# Patient Record
Sex: Female | Born: 1975
Health system: Southern US, Community
[De-identification: ages and names within clinical notes are randomized; demographics above are authoritative.]

## PROBLEM LIST (undated history)

## (undated) DIAGNOSIS — K219 Gastro-esophageal reflux disease without esophagitis: Secondary | ICD-10-CM

## (undated) HISTORY — DX: Gastro-esophageal reflux disease without esophagitis: K21.9

## (undated) HISTORY — PX: TUBAL LIGATION: SHX77

## (undated) HISTORY — PX: FOOT SURGERY: SHX648

---

## 1999-08-12 ENCOUNTER — Other Ambulatory Visit: Admission: RE | Admit: 1999-08-12 | Discharge: 1999-08-12 | Payer: Self-pay | Admitting: Obstetrics

## 2000-01-12 ENCOUNTER — Inpatient Hospital Stay (HOSPITAL_COMMUNITY): Admission: AD | Admit: 2000-01-12 | Discharge: 2000-01-12 | Payer: Self-pay | Admitting: Obstetrics

## 2000-01-14 ENCOUNTER — Inpatient Hospital Stay (HOSPITAL_COMMUNITY): Admission: AD | Admit: 2000-01-14 | Discharge: 2000-01-16 | Payer: Self-pay | Admitting: Obstetrics

## 2000-10-12 ENCOUNTER — Emergency Department (HOSPITAL_COMMUNITY): Admission: EM | Admit: 2000-10-12 | Discharge: 2000-10-12 | Payer: Self-pay | Admitting: Emergency Medicine

## 2000-10-13 ENCOUNTER — Emergency Department (HOSPITAL_COMMUNITY): Admission: EM | Admit: 2000-10-13 | Discharge: 2000-10-13 | Payer: Self-pay | Admitting: Emergency Medicine

## 2001-06-08 ENCOUNTER — Emergency Department (HOSPITAL_COMMUNITY): Admission: EM | Admit: 2001-06-08 | Discharge: 2001-06-08 | Payer: Self-pay | Admitting: Emergency Medicine

## 2002-02-18 ENCOUNTER — Encounter (HOSPITAL_COMMUNITY): Admission: AD | Admit: 2002-02-18 | Discharge: 2002-02-18 | Payer: Self-pay | Admitting: Obstetrics

## 2002-02-22 ENCOUNTER — Inpatient Hospital Stay (HOSPITAL_COMMUNITY): Admission: AD | Admit: 2002-02-22 | Discharge: 2002-02-25 | Payer: Self-pay | Admitting: Obstetrics

## 2003-03-11 ENCOUNTER — Emergency Department (HOSPITAL_COMMUNITY): Admission: EM | Admit: 2003-03-11 | Discharge: 2003-03-11 | Payer: Self-pay | Admitting: Emergency Medicine

## 2003-03-11 ENCOUNTER — Encounter: Payer: Self-pay | Admitting: Orthopedic Surgery

## 2003-03-11 ENCOUNTER — Encounter: Payer: Self-pay | Admitting: Emergency Medicine

## 2003-03-16 ENCOUNTER — Observation Stay (HOSPITAL_COMMUNITY): Admission: RE | Admit: 2003-03-16 | Discharge: 2003-03-17 | Payer: Self-pay | Admitting: Orthopedic Surgery

## 2003-03-16 ENCOUNTER — Encounter: Payer: Self-pay | Admitting: Orthopedic Surgery

## 2006-08-22 ENCOUNTER — Emergency Department (HOSPITAL_COMMUNITY): Admission: EM | Admit: 2006-08-22 | Discharge: 2006-08-23 | Payer: Self-pay | Admitting: Emergency Medicine

## 2006-08-23 ENCOUNTER — Emergency Department (HOSPITAL_COMMUNITY): Admission: EM | Admit: 2006-08-23 | Discharge: 2006-08-23 | Payer: Self-pay | Admitting: Family Medicine

## 2006-10-18 ENCOUNTER — Emergency Department (HOSPITAL_COMMUNITY): Admission: EM | Admit: 2006-10-18 | Discharge: 2006-10-18 | Payer: Self-pay | Admitting: Emergency Medicine

## 2012-12-20 ENCOUNTER — Encounter (HOSPITAL_COMMUNITY): Payer: Self-pay | Admitting: Emergency Medicine

## 2012-12-20 ENCOUNTER — Emergency Department (HOSPITAL_COMMUNITY)
Admission: EM | Admit: 2012-12-20 | Discharge: 2012-12-20 | Disposition: A | Discharge status: Home / Self Care | Payer: 59 | Source: Home / Self Care | Attending: Emergency Medicine | Admitting: Emergency Medicine

## 2012-12-20 DIAGNOSIS — S93409A Sprain of unspecified ligament of unspecified ankle, initial encounter: Secondary | ICD-10-CM

## 2012-12-20 MED ORDER — IBUPROFEN 800 MG PO TABS
800.0000 mg | ORAL_TABLET | Freq: Once | ORAL | Status: AC
  Administered 2012-12-20: 800 mg via ORAL

## 2012-12-20 MED ORDER — IBUPROFEN 800 MG PO TABS: 800.0000 mg | ORAL_TABLET | Freq: Three times a day (TID) | ORAL | Status: DC | PRN

## 2012-12-20 MED ORDER — IBUPROFEN 800 MG PO TABS
ORAL_TABLET | ORAL | Status: AC
  Filled 2012-12-20: qty 1

## 2012-12-20 NOTE — ED Provider Notes (Signed)
History     CSN: [id number]  Arrival date & time 12/20/12  1827   First MD Initiated Contact with Patient 12/20/12 1928      Chief Complaint  Patient presents with  . Foot Injury    (Consider location/radiation/quality/duration/timing/severity/associated sxs/prior treatment) HPI Comments: Pt rolled ankle while playing volleyball this afternoon.  Initially, for 3.75 hours, felt fine - had no pain or swelling.  After rested for a brief while, got up and noticed L ankle pain and swelling.    Patient is a 37 y.o. female presenting with foot injury. The history is provided by the patient.  Foot Injury Location:  Ankle Time since incident:  6 hours Injury: yes   Mechanism of injury comment:  Foot eversion Ankle location:  L ankle Pain details:    Quality:  Aching and throbbing   Radiates to:  Does not radiate   Severity:  Moderate   Onset quality:  Gradual   Duration:  3 hours   Timing:  Constant   Progression:  Worsening Chronicity:  New Relieved by:  None tried Worsened by:  Bearing weight and activity Ineffective treatments:  Ice and elevation Associated symptoms: swelling   Associated symptoms: no numbness and no tingling     History reviewed. No pertinent past medical history.  Past Surgical History  Procedure Laterality Date  . Foot surgery Right     History reviewed. No pertinent family history.  History  Substance Use Topics  . Smoking status: Not on file  . Smokeless tobacco: Not on file  . Alcohol Use: Not on file    OB History   Grav Para Term Preterm Abortions TAB SAB Ect Mult Living                  Review of Systems  Musculoskeletal:       L ankle pain  Skin: Negative for color change and wound.  Neurological: Negative for numbness.    Allergies  Review of patient's allergies indicates no known allergies.  Home Medications   Current Outpatient Rx  Name  Route  Sig  Dispense  Refill  . ibuprofen (ADVIL,MOTRIN) 800 MG tablet   Oral  Take 1 tablet (800 mg total) by mouth every 8 (eight) hours as needed for pain.   21 tablet   0     BP 117/72  Pulse 89  Temp(Src) 100.2 F (37.9 C) (Oral)  Resp 18  SpO2 99%  LMP 12/06/2012  Physical Exam  Constitutional: She appears well-developed and well-nourished. No distress.  Musculoskeletal:       Left ankle: She exhibits decreased range of motion and swelling. She exhibits no deformity and normal pulse. Tenderness. Lateral malleolus and medial malleolus tenderness found.  L ankle with swelling and tender to palp medial and lateral malleoli- also decreased rom due to pain  Neurological: No sensory deficit.    ED Course  Procedures (including critical care time)  Labs Reviewed - No data to display No results found.   1. Ankle sprain and strain, left, initial encounter       MDM  Unlikely fx as pt was able to walk on injury without pain or problem for 3+ hours. Tx for sprain.         Cathlyn Parsons, NP 12/20/12 1935

## 2012-12-20 NOTE — ED Notes (Signed)
Pt left w/o discharge instructions; Rx; and work note Called pt and per her request, left the info at the front office

## 2012-12-20 NOTE — ED Provider Notes (Signed)
Medical screening examination/treatment/procedure(s) were performed by non-physician practitioner and as supervising physician I was immediately available for consultation/collaboration.  Raynald Blend, MD 12/20/12 [phone]

## 2012-12-20 NOTE — ED Notes (Signed)
Pt c/o left foot/ankle inj onset 1300 today Reports playing volleyball, jumped up and fell on left foot and twisted her ankle Placed ice initially and did not feel much pain; was walking and applying pressure until she got home and took her shoe off around 1645 Since then, pain gradually getting worse Sx include: swelling, pain that increases w/activity Denies: head inj/LOC  She is alert and oriented w/no signs of acute distress.

## 2014-09-12 ENCOUNTER — Other Ambulatory Visit: Payer: Self-pay | Admitting: Physician Assistant

## 2014-09-12 ENCOUNTER — Other Ambulatory Visit (HOSPITAL_COMMUNITY)
Admission: RE | Admit: 2014-09-12 | Discharge: 2014-09-12 | Disposition: A | Discharge status: Home / Self Care | Payer: 59 | Source: Ambulatory Visit | Attending: Family Medicine | Admitting: Family Medicine

## 2014-09-12 DIAGNOSIS — Z01419 Encounter for gynecological examination (general) (routine) without abnormal findings: Secondary | ICD-10-CM | POA: Insufficient documentation

## 2014-09-14 LAB — CYTOLOGY - PAP

## 2015-05-29 ENCOUNTER — Other Ambulatory Visit: Payer: Self-pay | Admitting: Podiatry

## 2015-05-30 NOTE — Telephone Encounter (Signed)
Pt request refill of Lamisil.

## 2015-06-07 ENCOUNTER — Ambulatory Visit: Payer: Self-pay | Admitting: Podiatry

## 2015-06-29 ENCOUNTER — Ambulatory Visit (INDEPENDENT_AMBULATORY_CARE_PROVIDER_SITE_OTHER): Payer: 59 | Admitting: Podiatry

## 2015-06-29 ENCOUNTER — Encounter: Payer: Self-pay | Admitting: Podiatry

## 2015-06-29 VITALS — BP 104/67 | HR 66 | Resp 16

## 2015-06-29 DIAGNOSIS — M79676 Pain in unspecified toe(s): Secondary | ICD-10-CM

## 2015-06-29 DIAGNOSIS — B351 Tinea unguium: Secondary | ICD-10-CM

## 2015-06-29 MED ORDER — TERBINAFINE HCL 250 MG PO TABS
250.0000 mg | ORAL_TABLET | Freq: Every day | ORAL | Status: DC
Start: 2015-06-29 — End: 2019-08-06

## 2015-06-29 NOTE — Progress Notes (Signed)
Subjective:     Patient ID: Julie Love, female   DOB: [birth year], 39 y.o.   MRN: [id number]  HPIThis patient presents to the office with follow up for fungus infected nails.  She has taken lamisil and marked improvement to the nails have occurred except second toenails both feet.  She returns for follow up for nail fungus.   Review of Systems     Objective:   Physical Exam GENERAL APPEARANCE: Alert, conversant. Appropriately groomed. No acute distress.  VASCULAR: Pedal pulses palpable at  Scnetx and PT bilateral.  Capillary refill time is immediate to all digits,  Normal temperature gradient.  Digital hair growth is present bilateral  NEUROLOGIC: sensation is normal to 5.07 monofilament at 5/5 sites bilateral.  Light touch is intact bilateral, Muscle strength normal.  MUSCULOSKELETAL: acceptable muscle strength, tone and stability bilateral.  Intrinsic muscluature intact bilateral.  Rectus appearance of foot and digits noted bilateral.   DERMATOLOGIC: skin color, texture, and turgor are within normal limits.  No preulcerative lesions or ulcers  are seen, no interdigital maceration noted.  No open lesions present.  Digital nails are asymptomatic. No drainage noted. NAILS  Her hallux and saecond toenails have disfigured and discolorment persisting.     Assessment:     Onychomycosis B/L     Plan:     ROV  Refill lamisil 250 mg # 45.  Told her the great nails are doing well proximally but her second toes need additional treatment.  RTCD prn

## 2015-07-18 ENCOUNTER — Ambulatory Visit: Payer: Self-pay | Admitting: Podiatry

## 2015-09-14 ENCOUNTER — Other Ambulatory Visit: Payer: Self-pay | Admitting: Physician Assistant

## 2015-09-14 ENCOUNTER — Other Ambulatory Visit (HOSPITAL_COMMUNITY)
Admission: RE | Admit: 2015-09-14 | Discharge: 2015-09-14 | Disposition: A | Discharge status: Home / Self Care | Payer: 59 | Source: Ambulatory Visit | Attending: Family Medicine | Admitting: Family Medicine

## 2015-09-14 DIAGNOSIS — Z124 Encounter for screening for malignant neoplasm of cervix: Secondary | ICD-10-CM | POA: Insufficient documentation

## 2015-09-17 LAB — CYTOLOGY - PAP

## 2016-09-23 DIAGNOSIS — Z0000 Encounter for general adult medical examination without abnormal findings: Secondary | ICD-10-CM | POA: Diagnosis not present

## 2016-12-04 ENCOUNTER — Encounter (HOSPITAL_COMMUNITY): Payer: Self-pay | Admitting: Emergency Medicine

## 2016-12-04 ENCOUNTER — Ambulatory Visit (HOSPITAL_COMMUNITY)
Admission: EM | Admit: 2016-12-04 | Discharge: 2016-12-04 | Disposition: A | Discharge status: Home / Self Care | Payer: 59 | Attending: Internal Medicine | Admitting: Internal Medicine

## 2016-12-04 DIAGNOSIS — S39012A Strain of muscle, fascia and tendon of lower back, initial encounter: Secondary | ICD-10-CM | POA: Diagnosis not present

## 2016-12-04 MED ORDER — CYCLOBENZAPRINE HCL 5 MG PO TABS: 5.0000 mg | ORAL_TABLET | Freq: Two times a day (BID) | ORAL | 0 refills | Status: DC | PRN

## 2016-12-04 MED ORDER — MELOXICAM 7.5 MG PO TABS: 7.5000 mg | ORAL_TABLET | Freq: Every day | ORAL | 0 refills | Status: DC

## 2016-12-04 NOTE — Discharge Instructions (Signed)
°  Flexeril is a muscle relaxer and may cause drowsiness. Do not drink alcohol, drive, or operate heavy machinery while taking.  Meloxicam (Mobic) is an antiinflammatory to help with pain and inflammation.  Do not take ibuprofen, Advil, Aleve, or any other medications that contain NSAIDs while taking meloxicam as this may cause stomach upset or even ulcers if taken in large amounts for an extended period of time.
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
## 2016-12-11 DIAGNOSIS — M542 Cervicalgia: Secondary | ICD-10-CM | POA: Diagnosis not present

## 2016-12-11 DIAGNOSIS — M546 Pain in thoracic spine: Secondary | ICD-10-CM | POA: Diagnosis not present

## 2017-01-12 ENCOUNTER — Encounter: Payer: Self-pay | Admitting: Family Medicine

## 2017-01-12 ENCOUNTER — Ambulatory Visit (INDEPENDENT_AMBULATORY_CARE_PROVIDER_SITE_OTHER): Payer: No Typology Code available for payment source | Admitting: Family Medicine

## 2017-01-12 VITALS — BP 114/64 | HR 70 | Temp 97.3°F | Resp 12 | Ht 67.0 in | Wt 217.2 lb

## 2017-01-12 DIAGNOSIS — R200 Anesthesia of skin: Secondary | ICD-10-CM

## 2017-01-12 DIAGNOSIS — E6609 Other obesity due to excess calories: Secondary | ICD-10-CM | POA: Diagnosis not present

## 2017-01-12 DIAGNOSIS — E669 Obesity, unspecified: Secondary | ICD-10-CM | POA: Insufficient documentation

## 2017-01-12 DIAGNOSIS — Z6834 Body mass index (BMI) 34.0-34.9, adult: Secondary | ICD-10-CM

## 2017-01-12 DIAGNOSIS — M549 Dorsalgia, unspecified: Secondary | ICD-10-CM | POA: Diagnosis not present

## 2017-01-12 DIAGNOSIS — S3992XD Unspecified injury of lower back, subsequent encounter: Secondary | ICD-10-CM

## 2017-01-12 MED ORDER — MELOXICAM 7.5 MG PO TABS: 7.5000 mg | ORAL_TABLET | Freq: Every day | ORAL | 0 refills | Status: DC

## 2017-01-12 MED ORDER — METHOCARBAMOL 500 MG PO TABS: 500.0000 mg | ORAL_TABLET | Freq: Three times a day (TID) | ORAL | 1 refills | Status: DC | PRN
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
## 2017-01-12 NOTE — Progress Notes (Signed)
Pre visit review using our clinic review tool, if applicable. No additional management support is needed unless otherwise documented below in the visit note. 

## 2017-01-12 NOTE — Patient Instructions (Addendum)
WE NOW OFFER   [facility] FAST TRACK!!!  SAME DAY Appointments for ACUTE CARE  Such as: Sprains, Injuries, cuts, abrasions, rashes, muscle pain, joint pain, back pain Colds, flu, sore throats, headache, allergies, cough, fever  Ear pain, sinus and eye infections Abdominal pain, nausea, vomiting, diarrhea, upset stomach Animal/insect bites  3 Easy Ways to Schedule: Walk-In Scheduling Call in scheduling Mychart Sign-up: [email or web address]   A few things to remember from today's visit:   Acute right-sided back pain, unspecified back location - Plan: Ambulatory referral to Orthopedic Surgery, DG Lumbar Spine Complete, DG Thoracic Spine W/Swimmers, methocarbamol (ROBAXIN) 500 MG tablet, meloxicam (MOBIC) 7.5 MG tablet  Class 1 obesity due to excess calories without serious comorbidity with body mass index (BMI) of 34.0 to 34.9 in adult  Injury of back, subsequent encounter - Plan: Ambulatory referral to Orthopedic Surgery, DG Lumbar Spine Complete, DG Thoracic Spine W/Swimmers, methocarbamol (ROBAXIN) 500 MG tablet, meloxicam (MOBIC) 7.5 MG tablet  Finger numbness  Today X ray was ordered.  This can be done at [facility] between 8 am and 5 pm: [place]. [phone].   Please be sure medication list is accurate. If a new problem present, please set up appointment sooner than planned today.

## 2017-01-13 ENCOUNTER — Ambulatory Visit (INDEPENDENT_AMBULATORY_CARE_PROVIDER_SITE_OTHER)
Admission: RE | Admit: 2017-01-13 | Discharge: 2017-01-13 | Disposition: A | Discharge status: Home / Self Care | Payer: No Typology Code available for payment source | Source: Ambulatory Visit | Attending: Family Medicine | Admitting: Family Medicine

## 2017-01-13 DIAGNOSIS — M549 Dorsalgia, unspecified: Secondary | ICD-10-CM

## 2017-01-13 DIAGNOSIS — S3992XD Unspecified injury of lower back, subsequent encounter: Secondary | ICD-10-CM

## 2017-01-17 ENCOUNTER — Encounter: Payer: Self-pay | Admitting: Family Medicine

## 2017-01-22 ENCOUNTER — Ambulatory Visit (INDEPENDENT_AMBULATORY_CARE_PROVIDER_SITE_OTHER): Payer: No Typology Code available for payment source | Admitting: Orthopedic Surgery

## 2017-01-22 ENCOUNTER — Encounter (INDEPENDENT_AMBULATORY_CARE_PROVIDER_SITE_OTHER): Payer: Self-pay | Admitting: Orthopedic Surgery

## 2017-01-22 DIAGNOSIS — M549 Dorsalgia, unspecified: Secondary | ICD-10-CM
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
## 2017-02-01 ENCOUNTER — Ambulatory Visit
Admission: RE | Admit: 2017-02-01 | Discharge: 2017-02-01 | Disposition: A | Discharge status: Home / Self Care | Payer: No Typology Code available for payment source | Source: Ambulatory Visit | Attending: Orthopedic Surgery | Admitting: Orthopedic Surgery

## 2017-02-01 DIAGNOSIS — M549 Dorsalgia, unspecified: Secondary | ICD-10-CM

## 2017-02-11 ENCOUNTER — Ambulatory Visit (INDEPENDENT_AMBULATORY_CARE_PROVIDER_SITE_OTHER): Payer: No Typology Code available for payment source | Admitting: Orthopedic Surgery

## 2017-02-11 ENCOUNTER — Encounter (INDEPENDENT_AMBULATORY_CARE_PROVIDER_SITE_OTHER): Payer: Self-pay | Admitting: Orthopedic Surgery

## 2017-02-11 DIAGNOSIS — M546 Pain in thoracic spine: Secondary | ICD-10-CM | POA: Diagnosis not present
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
## 2017-02-16 ENCOUNTER — Telehealth (INDEPENDENT_AMBULATORY_CARE_PROVIDER_SITE_OTHER): Payer: Self-pay | Admitting: Orthopedic Surgery

## 2017-02-16 DIAGNOSIS — M546 Pain in thoracic spine: Secondary | ICD-10-CM

## 2017-02-16 NOTE — Telephone Encounter (Signed)
[facility] called to state that they are not in network/contracted with the patient's insurance [employer]. Please call patient and refer to another Physical Therapy place.

## 2017-02-16 NOTE — Telephone Encounter (Signed)
S/w patient and referred to [facility] on [address] per her request.

## 2017-02-26 ENCOUNTER — Ambulatory Visit: Payer: No Typology Code available for payment source | Attending: Orthopedic Surgery | Admitting: Physical Therapy

## 2017-02-26 DIAGNOSIS — M546 Pain in thoracic spine: Secondary | ICD-10-CM | POA: Diagnosis present

## 2017-02-26 DIAGNOSIS — M6281 Muscle weakness (generalized): Secondary | ICD-10-CM | POA: Diagnosis present

## 2017-02-26 DIAGNOSIS — M62838 Other muscle spasm: Secondary | ICD-10-CM | POA: Diagnosis present
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
## 2017-03-05 ENCOUNTER — Ambulatory Visit: Payer: No Typology Code available for payment source | Attending: Orthopedic Surgery | Admitting: Physical Therapy

## 2017-03-05 ENCOUNTER — Encounter: Payer: Self-pay | Admitting: Physical Therapy

## 2017-03-05 DIAGNOSIS — M62838 Other muscle spasm: Secondary | ICD-10-CM | POA: Insufficient documentation

## 2017-03-05 DIAGNOSIS — M6281 Muscle weakness (generalized): Secondary | ICD-10-CM | POA: Insufficient documentation

## 2017-03-05 DIAGNOSIS — M546 Pain in thoracic spine: Secondary | ICD-10-CM | POA: Diagnosis not present

## 2017-03-05 NOTE — Therapy (Signed)
[place], [place], [address] Phone: [phone]   Fax:  [phone]  Physical Therapy Treatment  Patient Details  Name: Julie Love MRN: [id number] Date of Birth: Jan 05, 1976 Referring Provider: Dr Meredith Pel   Encounter Date: 03/05/2017      PT End of Session - 03/05/17 2130    Visit Number 2   Number of Visits 16   Date for PT Re-Evaluation 04/24/17   Authorization Type Unkown    PT Start Time 1015   PT Stop Time 1107   PT Time Calculation (min) 52 min   Activity Tolerance Patient tolerated treatment well   Behavior During Therapy [facility] for tasks assessed/performed      Past Medical History:  Diagnosis Date  . GERD (gastroesophageal reflux disease)     Past Surgical History:  Procedure Laterality Date  . CESAREAN SECTION    . FOOT SURGERY Right   . TUBAL LIGATION      There were no vitals filed for this visit.      Subjective Assessment - 03/05/17 1040    Subjective Patient continues to be sore but she has been using her stretches at work to keep her out of pain. Overall it feels about the same but she is managing better. She had to do a lot of lifting at work and she was sore when she came in.    Limitations House hold activities   Diagnostic tests Thoracic x-ray: (-) for acute    Patient Stated Goals To have less pain when working    Currently in Pain? Yes   Pain Score 9    Pain Location Back   Pain Orientation Left;Mid;Upper   Pain Descriptors / Indicators Sharp;Aching   Pain Type Chronic pain   Pain Onset More than a month ago   Pain Frequency Constant   Aggravating Factors  working and lifting    Pain Relieving Factors laying flat    Effect of Pain on Daily Activities increased pain when working                          [facility] Adult PT Treatment/Exercise - 03/05/17 0001      Lumbar Exercises: Seated   Other Seated Lumbar Exercises thoracic ball  roll x10 forard 2x5 lateral;  ball press down with abdominal bracing      Shoulder Exercises: Prone   Other Prone Exercises --     Shoulder Exercises: Standing   Other Standing Exercises standing shoulder extenstion 2x10 red; Shoulder retraction 2x10 red   Other Standing Exercises Ball v wall 2x20 cw/ ccw ball press into wall 2x10 bilateral      Modalities   Modalities Moist Heat     Moist Heat Therapy   Number Minutes Moist Heat 10 Minutes   Moist Heat Location Other (comment)  thoriacic      Manual Therapy   Manual therapy comments trigger point release to medial scpaualr border/ Thoraic area to reduce inflammtion. Corss hand Grade III throacic manipulation.       Prosthetics   Prosthetic Care Comments  Light                 PT Education - 03/05/17 2124    Education provided Yes   Education Details improtance of strengthening    Person(s) Educated Patient   Methods Explanation;Demonstration   Comprehension Verbalized understanding;Returned demonstration;Verbal cues required;Tactile cues required  PT Short Term Goals - 02/27/17 0641      PT SHORT TERM GOAL #1   Title Patient will demsotrate decreased spasiming of the medial scapualr border    Time 4   Period Weeks   Status New     PT SHORT TERM GOAL #2   Title Patient will be independent with inital HEP for postrual correction and scpaular strength    Time 4   Period Weeks   Status New     PT SHORT TERM GOAL #3   Title Patient will demsotrate full throacic rotation and extension without a significant increase in pain    Time 4   Period Weeks   Status New     PT SHORT TERM GOAL #4   Title Patient will increase gross right scpaular strength to 4+/5    Time 4   Period Weeks   Status New           PT Long Term Goals - 02/27/17 4098      PT LONG TERM GOAL #1   Title Patient will return to home exercises including planks and other high level core work without increased pain    Time 8    Period Weeks   Status New     PT LONG TERM GOAL #2   Title Patient will lift a 10 lb box 10x without increased pain in order to perfrom work tasks    Time 8   Period Weeks   Status New     PT LONG TERM GOAL #3   Title Patient will demsotrate a 19% limitation on FOTO    Time 8   Period Weeks   Status New               Plan - 03/05/17 2133    Clinical Impression Statement Patient reported inmproved pain and motion after treatment. She was encouraged to continue strengthening. Patient has significant trigger points. She may benefit from trigger point dry needling to improve symptoms. Therapy will attmept next visit.    Clinical Presentation Evolving   Clinical Decision Making Low   Rehab Potential Good   PT Frequency 2x / week   PT Duration 8 weeks   PT Treatment/Interventions ADLs/Self Care Home Management;Cryotherapy;Electrical Stimulation;Iontophoresis 4mg /ml Dexamethasone;Gait training;Stair training;Moist Heat;Ultrasound;Therapeutic activities;Therapeutic exercise;Patient/family education;Passive range of motion;Manual techniques;Dry needling;Splinting;Taping;Neuromuscular re-education   PT Next Visit Plan Continue with scpaular strengthening, Consider prone extension, rows, Wall press ups; Review stretches and see if any have helped. Continue with manual therapy. Review work Administrator up    [place] prayer, lateral prayer, posterior capusule stretch with rotation, Mid throacic stretch over head; Sink stretch, tennis ball trigger point release; Scpa retraction, Scap extension    Consulted and Agree with Plan of Care Patient      Patient will benefit from skilled therapeutic intervention in order to improve the following deficits and impairments:  Decreased activity tolerance, Decreased strength, Increased muscle spasms, Impaired UE functional use, Pain  Visit Diagnosis: Pain in thoracic spine  Other muscle spasm  Muscle weakness  (generalized)     Problem List Patient Active Problem List   Diagnosis Date Noted  . Class 1 obesity without serious comorbidity with body mass index (BMI) of 34.0 to 34.9 in adult 01/12/2017    Carney Living PT DPT  03/05/2017, 9:38 PM  [facility] [address] [place], [place], [address] Phone: [phone]   Fax:  [phone]  Name: Julie  KRITHI Love MRN: [id number] Date of Birth: 19-Mar-1976

## 2017-03-10 ENCOUNTER — Ambulatory Visit: Payer: No Typology Code available for payment source | Admitting: Physical Therapy

## 2017-03-10 DIAGNOSIS — M546 Pain in thoracic spine: Secondary | ICD-10-CM

## 2017-03-10 DIAGNOSIS — M6281 Muscle weakness (generalized): Secondary | ICD-10-CM

## 2017-03-10 DIAGNOSIS — M62838 Other muscle spasm: Secondary | ICD-10-CM

## 2017-03-12 NOTE — Therapy (Signed)
[place], [place], [address] Phone: [phone]   Fax:  [phone]  Physical Therapy Treatment  Patient Details  Name: Julie Love MRN: [id number] Date of Birth: 1976/05/24 Referring Provider: Dr Meredith Pel   Encounter Date: 03/10/2017    Past Medical History:  Diagnosis Date  . GERD (gastroesophageal reflux disease)     Past Surgical History:  Procedure Laterality Date  . CESAREAN SECTION    . FOOT SURGERY Right   . TUBAL LIGATION      There were no vitals filed for this visit.      Subjective Assessment - 03/12/17 1219    Subjective Patient reports her pain has been better. She is having some pain today but it is much less then it has been. She is using her stretches and her exercises.    Limitations House hold activities   Diagnostic tests Thoracic x-ray: (-) for acute    Patient Stated Goals To have less pain when working    Currently in Pain? Yes   Pain Score 3    Pain Location Back   Pain Orientation Left;Mid;Upper   Pain Type Chronic pain   Pain Onset More than a month ago   Aggravating Factors  working and lifting    Pain Relieving Factors laying flat    Effect of Pain on Daily Activities increased pain when working                          [facility] Adult PT Treatment/Exercise - 03/12/17 0001      Shoulder Exercises: Prone   Other Prone Exercises posterior capuale stretch with rotation 2x10      Shoulder Exercises: Standing   Other Standing Exercises standing shoulder extenstion 3x10 green; Shoulder retraction 3x10  green; Box lifts 2x5 with 10lb wieight in box; Stand and move box to the side with a step x5;    Other Standing Exercises Ball v wall 2x20 cw/ ccw ball press into wall 2x10 bilateral      Manual Therapy   Manual therapy comments trigger point release to medial scpaualr border/ Thoraic area to reduce inflammtion. Cross hand Grade III  throacic manipulation.  Softtissue mobilization around the area TPDN perfromed.           Trigger Point Dry Needling - 03/12/17 1221    Consent Given? Yes   Education Handout Provided Yes   Muscles Treated Upper Body --  thoracic multifidus T-7; T-8 and T-9 Twitch elicited                 PT Short Term Goals - 03/10/17 1317      PT SHORT TERM GOAL #1   Title Patient will demsotrate decreased spasiming of the medial scapualr border    Baseline decreased per palpation. Assess carryover.    Time 4   Period Weeks   Status On-going     PT SHORT TERM GOAL #2   Title Patient will be independent with inital HEP for postrual correction and scpaular strength    Time 4   Period Weeks   Status On-going     PT SHORT TERM GOAL #3   Title Patient will demsotrate full throacic rotation and extension without a significant increase in pain    Time 4   Period Weeks   Status On-going     PT SHORT TERM GOAL #4   Title Patient will increase gross right  scpaular strength to 4+/5    Time 4   Period Weeks   Status On-going           PT Long Term Goals - 02/27/17 5409      PT LONG TERM GOAL #1   Title Patient will return to home exercises including planks and other high level core work without increased pain    Time 8   Period Weeks   Status New     PT LONG TERM GOAL #2   Title Patient will lift a 10 lb box 10x without increased pain in order to perfrom work tasks    Time 8   Period Weeks   Status New     PT LONG TERM GOAL #3   Title Patient will demsotrate a 19% limitation on FOTO    Time 8   Period Weeks   Status New               Plan - 03/12/17 1222    Clinical Impression Statement Reviewd rationale and potential adverse effects of dry needling and sings and symptoms of Pneomothorax 2nd to TPDN over the lung fields. She agreed to [facility] and is aware of poetential reactions. She reports her back has been better. She has been able to keep herself out of pain  using stretches. She  has been doing her exercises as well. Therapy reviewed lifting technique with the patient. She required only min cuing.  Given green band for home exercises.    Rehab Potential Good   PT Frequency 2x / week   PT Duration 8 weeks   PT Treatment/Interventions ADLs/Self Care Home Management;Cryotherapy;Electrical Stimulation;Iontophoresis 4mg /ml Dexamethasone;Gait training;Stair training;Moist Heat;Ultrasound;Therapeutic activities;Therapeutic exercise;Patient/family education;Passive range of motion;Manual techniques;Dry needling;Splinting;Taping;Neuromuscular re-education   PT Next Visit Plan Continue with scpaular strengthening, Consider prone extension, rows, Wall press ups; Review stretches and see if any have helped. Continue with manual therapy. Review work Administrator up. Continue with TPDN.     PT Home Exercise Plan prayer, lateral prayer, posterior capusule stretch with rotation, Mid throacic stretch over head; Sink stretch, tennis ball trigger point release; Scpa retraction, Scap extension    Consulted and Agree with Plan of Care Patient      Patient will benefit from skilled therapeutic intervention in order to improve the following deficits and impairments:  Decreased activity tolerance, Decreased strength, Increased muscle spasms, Impaired UE functional use, Pain  Visit Diagnosis: Pain in thoracic spine  Other muscle spasm  Muscle weakness (generalized)     Problem List Patient Active Problem List   Diagnosis Date Noted  . Class 1 obesity without serious comorbidity with body mass index (BMI) of 34.0 to 34.9 in adult 01/12/2017    Carney Living PT DPT  03/12/2017, 12:23 PM  [facility] [address] [place], [place], [address] Phone: [phone]   Fax:  [phone]  Name: Julie Love MRN: [id number] Date of Birth: November 26, 1975

## 2017-03-17 ENCOUNTER — Ambulatory Visit: Payer: No Typology Code available for payment source | Admitting: Physical Therapy

## 2017-03-17 DIAGNOSIS — M62838 Other muscle spasm: Secondary | ICD-10-CM

## 2017-03-17 DIAGNOSIS — M6281 Muscle weakness (generalized): Secondary | ICD-10-CM

## 2017-03-17 DIAGNOSIS — M546 Pain in thoracic spine: Secondary | ICD-10-CM

## 2017-03-17 NOTE — Therapy (Signed)
[place], [place], [address] Phone: [phone]   Fax:  [phone]  Physical Therapy Treatment  Patient Details  Name: Julie Love MRN: [id number] Date of Birth: Apr 30, 1976 Referring Provider: Dr Meredith Pel   Encounter Date: 03/17/2017      PT End of Session - 03/17/17 1037    Visit Number 4   Number of Visits 16   Date for PT Re-Evaluation 04/24/17   Authorization Type Unkown    PT Start Time 1015   PT Stop Time 1103   PT Time Calculation (min) 48 min   Activity Tolerance Patient tolerated treatment well   Behavior During Therapy [facility] for tasks assessed/performed      Past Medical History:  Diagnosis Date  . GERD (gastroesophageal reflux disease)     Past Surgical History:  Procedure Laterality Date  . CESAREAN SECTION    . FOOT SURGERY Right   . TUBAL LIGATION      There were no vitals filed for this visit.      Subjective Assessment - 03/17/17 1237    Subjective Patient continues to report improvement. Today she is just having minor pain. She has been more aware of her posture at work. She has been doing her exercises.    Limitations House hold activities   Diagnostic tests Thoracic x-ray: (-) for acute    Patient Stated Goals To have less pain when working    Currently in Pain? Yes   Pain Score 7    Pain Location Back   Pain Descriptors / Indicators Sharp;Aching   Pain Type Chronic pain   Pain Onset More than a month ago   Pain Frequency Constant   Aggravating Factors  working and lifting    Pain Relieving Factors laying flat    Effect of Pain on Daily Activities increased pain with working                            Trigger Point Dry Needling - 03/17/17 1241    Consent Given? Yes   Education Handout Provided Yes   Muscles Treated Upper Body --  thoracic multifidus from t-5, t-6, t-7               PT Education - 03/17/17 1037     Education provided Yes   Education Details reviewed quaruped exercises, updated HEP; continue with strengthening    Person(s) Educated Patient   Methods Explanation;Demonstration   Comprehension Verbalized understanding;Returned demonstration          PT Short Term Goals - 03/17/17 1239      PT SHORT TERM GOAL #1   Title Patient will demsotrate decreased spasiming of the medial scapualr border    Baseline decreased per palpation. Assess carryover.    Time 4   Period Weeks   Status On-going     PT SHORT TERM GOAL #2   Title Patient will be independent with inital HEP for postrual correction and scpaular strength    Time 4   Period Weeks   Status On-going     PT SHORT TERM GOAL #3   Title Patient will demsotrate full throacic rotation and extension without a significant increase in pain    Time 4   Period Weeks   Status On-going     PT SHORT TERM GOAL #4   Title Patient will increase gross right scpaular strength to 4+/5  Time 4   Period Weeks   Status On-going           PT Long Term Goals - 02/27/17 1660      PT LONG TERM GOAL #1   Title Patient will return to home exercises including planks and other high level core work without increased pain    Time 8   Period Weeks   Status New     PT LONG TERM GOAL #2   Title Patient will lift a 10 lb box 10x without increased pain in order to perfrom work tasks    Time 8   Period Weeks   Status New     PT LONG TERM GOAL #3   Title Patient will demsotrate a 19% limitation on FOTO    Time 8   Period Weeks   Status New               Plan - 03/17/17 1046    Clinical Impression Statement Patient tolerated treatmewnt wll. Therapy advanced exercises. She had a minor increase in pain with standing Y's. She tolerated trigger point dry needling well. Therapy needled three spots along her left throcic multifidis. The last spot themost superiro eliceted a good twitch. No significant spasming felt today just minor  tightness. She did score 2% worse on her FOTO but upon intake she had reported no difficulty with moderate activity depsite having significant pain with moderate work tasks. She is making good progress. See goal specific progress below.    Clinical Presentation Evolving   Clinical Decision Making Low   Rehab Potential Good   PT Frequency 2x / week   PT Duration 8 weeks   PT Treatment/Interventions ADLs/Self Care Home Management;Cryotherapy;Electrical Stimulation;Iontophoresis 4mg /ml Dexamethasone;Gait training;Stair training;Moist Heat;Ultrasound;Therapeutic activities;Therapeutic exercise;Patient/family education;Passive range of motion;Manual techniques;Dry needling;Splinting;Taping;Neuromuscular re-education   PT Next Visit Plan Continue with TPDN; continue with exercises.    Consulted and Agree with Plan of Care Patient      Patient will benefit from skilled therapeutic intervention in order to improve the following deficits and impairments:  Decreased activity tolerance, Decreased strength, Increased muscle spasms, Impaired UE functional use, Pain  Visit Diagnosis: Pain in thoracic spine  Other muscle spasm  Muscle weakness (generalized)     Problem List Patient Active Problem List   Diagnosis Date Noted  . Class 1 obesity without serious comorbidity with body mass index (BMI) of 34.0 to 34.9 in adult 01/12/2017    Carney Living PT DPT  03/17/2017, 12:42 PM  [facility] [address] [place], [place], [address] Phone: [phone]   Fax:  [phone]  Name: Julie Love MRN: [id number] Date of Birth: [birth year]

## 2017-03-24 ENCOUNTER — Ambulatory Visit: Payer: No Typology Code available for payment source | Admitting: Physical Therapy

## 2017-03-24 DIAGNOSIS — M546 Pain in thoracic spine: Secondary | ICD-10-CM | POA: Diagnosis not present

## 2017-03-24 DIAGNOSIS — M6281 Muscle weakness (generalized): Secondary | ICD-10-CM

## 2017-03-24 DIAGNOSIS — M62838 Other muscle spasm: Secondary | ICD-10-CM

## 2017-03-25 ENCOUNTER — Encounter: Payer: Self-pay | Admitting: Physical Therapy

## 2017-03-25 NOTE — Therapy (Signed)
[place], [place], [address] Phone: [phone]   Fax:  [phone]  Physical Therapy Treatment  Patient Details  Name: Julie Love MRN: [id number] Date of Birth: May 10, 1976 Referring Provider: Dr Meredith Pel   Encounter Date: 03/24/2017      PT End of Session - 03/25/17 1333    Visit Number 5   Number of Visits 16   Date for PT Re-Evaluation 04/24/17   Authorization Type Unkown    PT Start Time 1015   PT Stop Time 1107   PT Time Calculation (min) 52 min   Activity Tolerance Patient tolerated treatment well   Behavior During Therapy [facility] for tasks assessed/performed      Past Medical History:  Diagnosis Date  . GERD (gastroesophageal reflux disease)     Past Surgical History:  Procedure Laterality Date  . CESAREAN SECTION    . FOOT SURGERY Right   . TUBAL LIGATION      There were no vitals filed for this visit.      Subjective Assessment - 03/24/17 1052    Subjective Patient continues to report improvement. She feels her pain is about a 5/10 after working all day.    Limitations House hold activities   Diagnostic tests Thoracic x-ray: (-) for acute    Patient Stated Goals To have less pain when working    Currently in Pain? Yes   Pain Score 5    Pain Location Back   Pain Orientation Left;Mid;Upper   Pain Descriptors / Indicators Aching   Pain Type Chronic pain   Pain Onset More than a month ago   Pain Frequency Constant   Aggravating Factors  working on lifting    Pain Relieving Factors lying flat    Effect of Pain on Daily Activities continued ;ain with working                          [facility] Adult PT Treatment/Exercise - 03/25/17 0001      Shoulder Exercises: Standing   Other Standing Exercises standing shoulder extenstion 3x10 green; Shoulder retraction 3x10  green; Box lifts 2x5 with ; Standing shoulder flexion with scap retraction at the wall2x10  3lb; standing shoulder scaption x10;    Other Standing Exercises Ball v wall 2x20 cw/ ccw ball press into wall 2x10 bilateral      Manual Therapy   Manual therapy comments trigger point release to medial scpaualr border/ Thoraic area to reduce inflammtion. Cross hand Grade III throacic manipulation.  Softtissue mobilization around the area TPDN perfromed.           Trigger Point Dry Needling - 03/25/17 1353    Consent Given? Yes   Education Handout Provided Yes   Muscles Treated Upper Body --  t-3 right paraspinal t-4 right paraspinal              PT Education - 03/25/17 1332    Education provided Yes   Education Details updated HEP for wall slides; continued education on dry needling    Person(s) Educated Patient   Methods Explanation;Demonstration   Comprehension Verbalized understanding;Returned demonstration          PT Short Term Goals - 03/17/17 1239      PT SHORT TERM GOAL #1   Title Patient will demsotrate decreased spasiming of the medial scapualr border    Baseline decreased per palpation. Assess carryover.    Time 4  Period Weeks   Status On-going     PT SHORT TERM GOAL #2   Title Patient will be independent with inital HEP for postrual correction and scpaular strength    Time 4   Period Weeks   Status On-going     PT SHORT TERM GOAL #3   Title Patient will demsotrate full throacic rotation and extension without a significant increase in pain    Time 4   Period Weeks   Status On-going     PT SHORT TERM GOAL #4   Title Patient will increase gross right scpaular strength to 4+/5    Time 4   Period Weeks   Status On-going           PT Long Term Goals - 02/27/17 0174      PT LONG TERM GOAL #1   Title Patient will return to home exercises including planks and other high level core work without increased pain    Time 8   Period Weeks   Status New     PT LONG TERM GOAL #2   Title Patient will lift a 10 lb box 10x without increased pain  in order to perfrom work tasks    Time 8   Period Weeks   Status New     PT LONG TERM GOAL #3   Title Patient will demsotrate a 19% limitation on FOTO    Time 8   Period Weeks   Status New               Plan - 03/25/17 1334    Clinical Impression Statement Patients pain is moving in the right direction. She continues to have pain that reaches a 5/10 but it is dropping. Her spasming is decreasing. Her tightness was higher today. Trigger point dry needling adressed t-3 and t4 today with a good twitch respose elicited at 3. She was encouraged to continue with her strengthening.   History and Personal Factors relevant to plan of care: nothing significant    Clinical Presentation Evolving   Clinical Decision Making Low   Rehab Potential Good   PT Frequency 2x / week   PT Duration 8 weeks   PT Treatment/Interventions ADLs/Self Care Home Management;Cryotherapy;Electrical Stimulation;Iontophoresis 4mg /ml Dexamethasone;Gait training;Stair training;Moist Heat;Ultrasound;Therapeutic activities;Therapeutic exercise;Patient/family education;Passive range of motion;Manual techniques;Dry needling;Splinting;Taping;Neuromuscular re-education   PT Next Visit Plan Continue with TPDN; continue with exercises.    PT Home Exercise Plan prayer, lateral prayer, posterior capusule stretch with rotation, Mid throacic stretch over head; Sink stretch, tennis ball trigger point release; Scpa retraction, Scap extension    Consulted and Agree with Plan of Care Patient      Patient will benefit from skilled therapeutic intervention in order to improve the following deficits and impairments:  Decreased activity tolerance, Decreased strength, Increased muscle spasms, Impaired UE functional use, Pain  Visit Diagnosis: Pain in thoracic spine  Other muscle spasm  Muscle weakness (generalized)     Problem List Patient Active Problem List   Diagnosis Date Noted  . Class 1 obesity without serious  comorbidity with body mass index (BMI) of 34.0 to 34.9 in adult 01/12/2017    Carney Living PT DPT  03/25/2017, 2:00 PM  [facility] [address] [place], [place], [address] Phone: [phone]   Fax:  [phone]  Name: Julie Love MRN: [id number] Date of Birth: Aug 19, 1976

## 2017-03-31 ENCOUNTER — Ambulatory Visit: Payer: No Typology Code available for payment source | Attending: Orthopedic Surgery | Admitting: Physical Therapy

## 2017-03-31 DIAGNOSIS — M546 Pain in thoracic spine: Secondary | ICD-10-CM | POA: Diagnosis present

## 2017-03-31 DIAGNOSIS — M6281 Muscle weakness (generalized): Secondary | ICD-10-CM | POA: Insufficient documentation

## 2017-03-31 DIAGNOSIS — M62838 Other muscle spasm: Secondary | ICD-10-CM | POA: Insufficient documentation

## 2017-03-31 NOTE — Therapy (Signed)
[place], [place], [address] Phone: [phone]   Fax:  [phone]  Physical Therapy Treatment  Patient Details  Name: CONNER NEISS MRN: [id number] Date of Birth: 01-Dec-1975 Referring Provider: Dr Meredith Pel   Encounter Date: 03/31/2017      PT End of Session - 03/31/17 1021    Visit Number 6   Number of Visits 16   Date for PT Re-Evaluation 04/24/17   Authorization Type Unkown    PT Start Time 1015   PT Stop Time 1108   PT Time Calculation (min) 53 min   Activity Tolerance Patient tolerated treatment well   Behavior During Therapy [facility] for tasks assessed/performed      Past Medical History:  Diagnosis Date  . GERD (gastroesophageal reflux disease)     Past Surgical History:  Procedure Laterality Date  . CESAREAN SECTION    . FOOT SURGERY Right   . TUBAL LIGATION      There were no vitals filed for this visit.      Subjective Assessment - 03/31/17 1339    Subjective Patient reports a continued improvement in pain, She still reports a 5/10 pain when working today but she was able to get her pain level down.    Limitations House hold activities   Diagnostic tests Thoracic x-ray: (-) for acute    Patient Stated Goals To have less pain when working    Currently in Pain? Yes   Pain Score 5    Pain Location Back   Pain Orientation Left;Mid;Upper   Pain Descriptors / Indicators Aching   Pain Type Chronic pain   Pain Onset More than a month ago   Pain Frequency Constant   Aggravating Factors  work on lifitng    Pain Relieving Factors lying flat    Effect of Pain on Daily Activities unbable to work                                  PT Education - 03/31/17 1347    Education provided Yes   Education Details continue with strengthening at home.    Person(s) Educated Patient   Methods Explanation;Demonstration;Tactile cues;Verbal cues   Comprehension  Verbalized understanding;Returned demonstration;Verbal cues required;Tactile cues required          PT Short Term Goals - 03/31/17 1352      PT SHORT TERM GOAL #1   Title Patient will demsotrate decreased spasiming of the medial scapualr border    Baseline decreased per palpation. Assess carryover.    Time 4   Period Weeks   Status On-going     PT SHORT TERM GOAL #2   Title Patient will be independent with inital HEP for postrual correction and scpaular strength    Time 4   Period Weeks   Status On-going     PT SHORT TERM GOAL #3   Title Patient will demsotrate full throacic rotation and extension without a significant increase in pain    Time 4   Period Weeks   Status On-going     PT SHORT TERM GOAL #4   Title Patient will increase gross right scpaular strength to 4+/5    Time 4   Period Weeks   Status On-going           PT Long Term Goals - 02/27/17 0174      PT LONG TERM GOAL #  1   Title Patient will return to home exercises including planks and other high level core work without increased pain    Time 8   Period Weeks   Status New     PT LONG TERM GOAL #2   Title Patient will lift a 10 lb box 10x without increased pain in order to perfrom work tasks    Time 8   Period Weeks   Status New     PT LONG TERM GOAL #3   Title Patient will demsotrate a 19% limitation on FOTO    Time 8   Period Weeks   Status New               Plan - 03/31/17 1351    Clinical Impression Statement Patient had good twitches with trigger point dry needling of the throacic multifidus and the peri scpaular muslces. Therapy used a shelf technique to needle the Rhomboids and levator.    History and Personal Factors relevant to plan of care: nothing significant    Clinical Presentation Evolving   Clinical Decision Making Low   Rehab Potential Good   PT Frequency 2x / week   PT Duration 8 weeks   PT Treatment/Interventions ADLs/Self Care Home  Management;Cryotherapy;Electrical Stimulation;Iontophoresis 4mg /ml Dexamethasone;Gait training;Stair training;Moist Heat;Ultrasound;Therapeutic activities;Therapeutic exercise;Patient/family education;Passive range of motion;Manual techniques;Dry needling;Splinting;Taping;Neuromuscular re-education   PT Next Visit Plan Continue with TPDN; continue with exercises.    PT Home Exercise Plan prayer, lateral prayer, posterior capusule stretch with rotation, Mid throacic stretch over head; Sink stretch, tennis ball trigger point release; Scpa retraction, Scap extension    Consulted and Agree with Plan of Care Patient      Patient will benefit from skilled therapeutic intervention in order to improve the following deficits and impairments:  Decreased activity tolerance, Decreased strength, Increased muscle spasms, Impaired UE functional use, Pain  Visit Diagnosis: Pain in thoracic spine  Other muscle spasm  Muscle weakness (generalized)     Problem List Patient Active Problem List   Diagnosis Date Noted  . Class 1 obesity without serious comorbidity with body mass index (BMI) of 34.0 to 34.9 in adult 01/12/2017    Carney Living PT DPT  03/31/2017, 1:55 PM  [facility] [address] [place], [place], [address] Phone: [phone]   Fax:  [phone]  Name: LILLEE MOONEYHAN MRN: [id number] Date of Birth: 1976-02-09

## 2017-04-09 ENCOUNTER — Ambulatory Visit (INDEPENDENT_AMBULATORY_CARE_PROVIDER_SITE_OTHER): Payer: No Typology Code available for payment source | Admitting: Orthopedic Surgery

## 2017-04-09 ENCOUNTER — Encounter (INDEPENDENT_AMBULATORY_CARE_PROVIDER_SITE_OTHER): Payer: Self-pay | Admitting: Orthopedic Surgery

## 2017-04-09 DIAGNOSIS — M546 Pain in thoracic spine: Secondary | ICD-10-CM | POA: Diagnosis not present

## 2017-04-09 MED ORDER — IBUPROFEN-FAMOTIDINE 800-26.6 MG PO TABS
ORAL_TABLET | ORAL | 0 refills | Status: DC
Start: 2017-04-09 — End: 2017-04-09

## 2017-04-09 MED ORDER — IBUPROFEN-FAMOTIDINE 800-26.6 MG PO TABS: ORAL_TABLET | ORAL | 0 refills | Status: DC

## 2017-04-10 NOTE — Progress Notes (Signed)
Office Visit Note   Patient: Julie Love           Date of Birth: [birth year]           MRN: [id number] Visit Date: 04/09/2017 Requested by: Martinique, Betty G, MD [address] [place], [place] [address] PCP: Martinique, Betty G, MD  Subjective: Chief Complaint  Patient presents with  . Middle Back - Pain, Follow-up    HPI: Patient is a 41 year old female with mid upper back pain.  Been going on for many months.  She went to physical therapy and finished last week but she still has a few more visits available if needed.  She does have a home exercise program.  She is out of Mobic but this was helping.  She can't take Robaxin because of drowsiness.  She does have thoracic type pain.  She has not's which require massage.  They did do dry needling and physical therapy to try to help.  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  MRI scan of the thoracic spine was fairly unremarkable.              ROS: All systems reviewed are negative as they relate to the chief complaint within the history of present illness.  Patient denies  fevers or chills.   Assessment & Plan: Visit Diagnoses:  1. Pain in thoracic spine     Plan: Impression is fairly debilitating thoracic pain.  This may be trigger point related or may just be radiculopathy without discrete structural abnormality.  Plan is to put her on Duexis instead of the motor vehicle just to get some stomach protective agents involved.  Also refer her to Dr. Ernestina Patches for consideration for T-spine epidural steroid injection.  We'll also like her to continue physical therapy for another 4-6 weeks.  I'll see her back as needed  Follow-Up Instructions: Return if symptoms worsen or fail to improve.   Orders:  Orders Placed This Encounter  Procedures  . Ambulatory referral to Physical Medicine Rehab   Meds ordered this encounter  Medications  . DISCONTD: Ibuprofen-Famotidine (DUEXIS) 800-26.6 MG TABS    Sig: 1  tablet as needed for pain qd-bid    Dispense:  60 tablet    Refill:  0  . Ibuprofen-Famotidine (DUEXIS) 800-26.6 MG TABS    Sig: 1 tablet as needed for pain qd-bid    Dispense:  60 tablet    Refill:  0      Procedures: No procedures performed   Clinical Data: No additional findings.  Objective: Vital Signs: There were no vitals taken for this visit.  Physical Exam:   Constitutional: Patient appears well-developed HEENT:  Head: Normocephalic Eyes:EOM are normal Neck: Normal range of motion Cardiovascular: Normal rate Pulmonary/chest: Effort normal Neurologic: Patient is alert Skin: Skin is warm Psychiatric: Patient has normal mood and affect    Ortho Exam: Orthopedic exam demonstrates good range of motion good ankle dorsi flexion plantar flexion quite hamstring strength does have focal pain and tenderness to palpation in the thoracic spine no rashes noted in this area.  No upper motor neuron symptoms in the lower extremities.  Does have some pain with forward lateral bending as well as with reaching.  No scapular dyskinesia with forward flexion.  Specialty Comments:  No specialty comments available.  Imaging: No results found.   PMFS History: Patient Active Problem List   Diagnosis Date Noted  . Pain in thoracic spine 04/09/2017  .  Class 1 obesity without serious comorbidity with body mass index (BMI) of 34.0 to 34.9 in adult 01/12/2017   Past Medical History:  Diagnosis Date  . GERD (gastroesophageal reflux disease)     Family History  Problem Relation Age of Onset  . Hypertension Mother   . Cancer Neg Hx   . Heart disease Neg Hx   . Diabetes Neg Hx     Past Surgical History:  Procedure Laterality Date  . CESAREAN SECTION    . FOOT SURGERY Right   . TUBAL LIGATION     Social History   Occupational History  . Not on file.   Social History Main Topics  . Smoking status: Never Smoker  . Smokeless tobacco: Never Used  . Alcohol use No  . Drug  use: No  . Sexual activity: Not on file

## 2017-04-14 ENCOUNTER — Ambulatory Visit: Payer: No Typology Code available for payment source | Admitting: Physical Therapy

## 2017-04-14 ENCOUNTER — Encounter: Payer: Self-pay | Admitting: Physical Therapy

## 2017-04-14 DIAGNOSIS — M546 Pain in thoracic spine: Secondary | ICD-10-CM

## 2017-04-14 DIAGNOSIS — M6281 Muscle weakness (generalized): Secondary | ICD-10-CM

## 2017-04-14 DIAGNOSIS — M62838 Other muscle spasm: Secondary | ICD-10-CM

## 2017-04-14 NOTE — Therapy (Signed)
[place], [place], [address] Phone: [phone]   Fax:  [phone]  Physical Therapy Treatment  Patient Details  Name: Julie Love MRN: [id number] Date of Birth: 1976-07-01 Referring Provider: Dr Meredith Pel   Encounter Date: 04/14/2017      PT End of Session - 04/14/17 1101    Visit Number 7   Number of Visits 16   Date for PT Re-Evaluation 04/24/17   PT Start Time 1017   PT Stop Time 1100   PT Time Calculation (min) 43 min   Activity Tolerance Patient tolerated treatment well   Behavior During Therapy [facility] for tasks assessed/performed      Past Medical History:  Diagnosis Date  . GERD (gastroesophageal reflux disease)     Past Surgical History:  Procedure Laterality Date  . CESAREAN SECTION    . FOOT SURGERY Right   . TUBAL LIGATION      There were no vitals filed for this visit.      Subjective Assessment - 04/14/17 1020    Subjective saw MD.  He ordered more PT, New script.  She is going to schedule thoracic spine injection.  She is going to start a new med,  not taking yet. Limits sleeping.  Able to work.   Currently in Pain? Yes   Pain Score 7    Pain Location Back   Pain Orientation Left;Mid;Upper   Pain Descriptors / Indicators Aching;Constant  pinch   Pain Type Chronic pain   Pain Frequency Constant   Aggravating Factors  work, lifting,  sitting long time,  wakes up at night. sitting unsupported   Pain Relieving Factors lying flat, tennis ball trigger point release,  posture changes,  stretches   Effect of Pain on Daily Activities frequent rests needed for work and LBP   Multiple Pain Sites No                         [facility] Adult PT Treatment/Exercise - 04/14/17 0001      Self-Care   Self-Care --  Posture ed with skeleton,  anatomy     Lumbar Exercises: Seated   Other Seated Lumbar Exercises seated YOGA trunk rotation stretch starting with  belly button and working up to neck 1 x each 5 seconds.       Shoulder Exercises: Supine   Other Supine Exercises supine scapular stabilization with green band,  ER,  sash, horizontal abduction, narrow grip shoulder flexionand ER.  10 X each, cues for technique. no pain increase.     Shoulder Exercises: Stretch   Corner Stretch 3 reps;10 seconds   Corner Stretch Limitations hands at shoulders using doorway   Other Shoulder Stretches thoracic stretch with rolled towel between shoulder baldes HEP.  helped decrease pain.                 PT Education - 04/14/17 1101    Education provided Yes   Education Details HEP,  posture/ anatomy   Person(s) Educated Patient   Methods Explanation;Demonstration;Verbal cues;Handout   Comprehension Verbalized understanding;Returned demonstration          PT Short Term Goals - 04/14/17 1124      PT SHORT TERM GOAL #1   Title Patient will demsotrate decreased spasiming of the medial scapualr border    Time 4   Period Weeks   Status Unable to assess     PT SHORT TERM GOAL #  2   Title Patient will be independent with inital HEP for postrual correction and scpaular strength    Baseline independent with exercises.  She is also compliant.     Time 4   Period Weeks   Status Achieved     PT SHORT TERM GOAL #3   Title Patient will demsotrate full throacic rotation and extension without a significant increase in pain    Baseline Able to do sitting with YOGA stretch no pain   Time 4   Period Weeks   Status Partially Met     PT SHORT TERM GOAL #4   Title Patient will increase gross right scpaular strength to 4+/5    Time 4   Period Weeks   Status Unable to assess           PT Long Term Goals - 02/27/17 0964      PT LONG TERM GOAL #1   Title Patient will return to home exercises including planks and other high level core work without increased pain    Time 8   Period Weeks   Status New     PT LONG TERM GOAL #2   Title Patient  will lift a 10 lb box 10x without increased pain in order to perfrom work tasks    Time 8   Period Weeks   Status New     PT LONG TERM GOAL #3   Title Patient will demsotrate a 19% limitation on FOTO    Time 8   Period Weeks   Status New               Plan - 04/14/17 1102    Clinical Impression Statement Pain 4/10 at end of session.  Able to progress her HEP to include bands for scapular stabilization.  She has a new script for more PT,  (It was given to office to scan)Left UE was stronger with diagonal band exercises than right. Progress with STG#3 with YOGA stretches without pain.STG#2 met.   PT Treatment/Interventions ADLs/Self Care Home Management;Cryotherapy;Electrical Stimulation;Iontophoresis 11m/ml Dexamethasone;Gait training;Stair training;Moist Heat;Ultrasound;Therapeutic activities;Therapeutic exercise;Patient/family education;Passive range of motion;Manual techniques;Dry needling;Splinting;Taping;Neuromuscular re-education   PT Next Visit Plan Continue with TPDN; continue with exercises. Review supine scapular stabilization .  Add Door way stretch and YOGA stretches sitting for trunk rotation starting at hips. to HEP   PT Home Exercise Plan prayer, lateral prayer, posterior capusule stretch with rotation, Mid throacic stretch over head; Sink stretch, tennis ball trigger point release; Scpa retraction, Scap extension    Consulted and Agree with Plan of Care Patient      Patient will benefit from skilled therapeutic intervention in order to improve the following deficits and impairments:  Decreased activity tolerance, Decreased strength, Increased muscle spasms, Impaired UE functional use, Pain  Visit Diagnosis: Pain in thoracic spine  Other muscle spasm  Muscle weakness (generalized)     Problem List Patient Active Problem List   Diagnosis Date Noted  . Pain in thoracic spine 04/09/2017  . Class 1 obesity without serious comorbidity with body mass index (BMI)  of 34.0 to 34.9 in adult 01/12/2017    [facility]PT[facility] 04/14/2017, 11:26 AM  [place] [address]Phone: [phone]  Fax:  [phone] Name: Julie BARHORSTMRN: [id number] of Birth: 121-Sep-1977

## 2017-04-14 NOTE — Patient Instructions (Addendum)
.  Over Head Pull: Narrow Grip       On back, knees bent, feet flat, band across thighs, elbows straight but relaxed. Pull hands apart (start). Keeping elbows straight, bring arms up and over head, hands toward floor. Keep pull steady on band. Hold momentarily. Return slowly, keeping pull steady, back to start. Repeat _10__ times. Band color ___Green___   Side Pull: Double Arm   On back, knees bent, feet flat. Arms perpendicular to body, shoulder level, elbows straight but relaxed. Pull arms out to sides, elbows straight. Resistance band comes across collarbones, hands toward floor. Hold momentarily. Slowly return to starting position. Repeat 10___ times. Band color __Green___   Sash   On back, knees bent, feet flat, left hand on left hip, right hand above left. Pull right arm DIAGONALLY (hip to shoulder) across chest. Bring right arm along head toward floor. Hold momentarily. Slowly return to starting position. Repeat _10__ times. Do with left arm. Band color     Green ______   Shoulder Rotation: Double Arm   On back, knees bent, feet flat, elbows tucked at sides, bent 90, hands palms up. Pull hands apart and down toward floor, keeping elbows near sides. Hold momentarily. Slowly return to starting position. Repeat _10__ times. Band color __green____      Also to roll towel and place between shoulder blades and rest 5 minutes with knees bent daily

## 2017-04-16 ENCOUNTER — Encounter: Payer: No Typology Code available for payment source | Admitting: Physical Therapy

## 2017-04-22 ENCOUNTER — Encounter: Payer: Self-pay | Admitting: Physical Therapy

## 2017-04-22 ENCOUNTER — Ambulatory Visit: Payer: No Typology Code available for payment source | Admitting: Physical Therapy

## 2017-04-22 DIAGNOSIS — M546 Pain in thoracic spine: Secondary | ICD-10-CM

## 2017-04-22 DIAGNOSIS — M62838 Other muscle spasm: Secondary | ICD-10-CM

## 2017-04-22 DIAGNOSIS — M6281 Muscle weakness (generalized): Secondary | ICD-10-CM

## 2017-04-22 NOTE — Therapy (Signed)
[place], [place], [address] Phone: [phone]   Fax:  [phone]  Physical Therapy Treatment  Patient Details  Name: Julie Love MRN: [id number] Date of Birth: [birth year] Referring Provider: Dr Meredith Pel   Encounter Date: 04/22/2017      PT End of Session - 04/22/17 0801    Visit Number 8   Number of Visits 16   Date for PT Re-Evaluation 04/24/17   PT Start Time 0732   PT Stop Time 0800   PT Time Calculation (min) 28 min   Activity Tolerance Patient tolerated treatment well   Behavior During Therapy [facility] for tasks assessed/performed      Past Medical History:  Diagnosis Date  . GERD (gastroesophageal reflux disease)     Past Surgical History:  Procedure Laterality Date  . CESAREAN SECTION    . FOOT SURGERY Right   . TUBAL LIGATION      There were no vitals filed for this visit.      Subjective Assessment - 04/22/17 0735    Subjective Pain up to 8/10 yesterday with lifting,  Doing exercises.  Has times with no pain.  Sleeping is getting better.   Currently in Pain? Yes   Pain Score 5   up to 8/10   Pain Location Back   Pain Orientation Left;Mid;Upper   Pain Descriptors / Indicators Aching;Constant   Pain Type Chronic pain   Pain Frequency Constant   Aggravating Factors  lifting   Pain Relieving Factors exercise,  posture changes   Effect of Pain on Daily Activities needs frequent reswts at work, and at home                         [facility] Adult PT Treatment/Exercise - 04/22/17 0001      Shoulder Exercises: Supine   Other Supine Exercises supine scapular stabilization with green band,  ER,  sash, horizontal abduction, narrow grip shoulder flexionand ER.  5 X each, cues for technique. no pain increase.     Shoulder Exercises: Prone   Other Prone Exercises Ball quadriped UE stab.    Rotation, elbow lift and alternate flexion/extension  10 X       Shoulder Exercises: Stretch   Corner Stretch 3 reps;10 seconds   Corner Stretch Limitations hands at shoulders using doorway  HEP                PT Education - 04/22/17 0801    Education provided Yes   Education Details HEP   Person(s) Educated Patient   Methods Explanation;Demonstration;Tactile cues;Verbal cues;Handout   Comprehension Verbalized understanding;Returned demonstration          PT Short Term Goals - 04/14/17 1124      PT SHORT TERM GOAL #1   Title Patient will demsotrate decreased spasiming of the medial scapualr border    Time 4   Period Weeks   Status Unable to assess     PT SHORT TERM GOAL #2   Title Patient will be independent with inital HEP for postrual correction and scpaular strength    Baseline independent with exercises.  She is also compliant.     Time 4   Period Weeks   Status Achieved     PT SHORT TERM GOAL #3   Title Patient will demsotrate full throacic rotation and extension without a significant increase in pain    Baseline Able to do sitting with YOGA  stretch no pain   Time 4   Period Weeks   Status Partially Met     PT SHORT TERM GOAL #4   Title Patient will increase gross right scpaular strength to 4+/5    Time 4   Period Weeks   Status Unable to assess           PT Long Term Goals - 02/27/17 1683      PT LONG TERM GOAL #1   Title Patient will return to home exercises including planks and other high level core work without increased pain    Time 8   Period Weeks   Status New     PT LONG TERM GOAL #2   Title Patient will lift a 10 lb box 10x without increased pain in order to perfrom work tasks    Time 8   Period Weeks   Status New     PT LONG TERM GOAL #3   Title Patient will demsotrate a 19% limitation on FOTO    Time 8   Period Weeks   Status New               Plan - 04/22/17 0803    Clinical Impression Statement 4-/5 scapula strength right,  LT 4/10.  Progress toward HEP goals.  Cues still  needed for ahep, minor.  No modalities needed at end of session.  Trunk rotation still limited in quadriped.   PT Treatment/Interventions ADLs/Self Care Home Management;Cryotherapy;Electrical Stimulation;Iontophoresis 26m/ml Dexamethasone;Gait training;Stair training;Moist Heat;Ultrasound;Therapeutic activities;Therapeutic exercise;Patient/family education;Passive range of motion;Manual techniques;Dry needling;Splinting;Taping;Neuromuscular re-education   PT Next Visit Plan Continue with TPDN; continue with exercises. Review quadriped ball stabilization for UE .   review Door way stretch and YOGA stretches sitting for trunk rotation starting at hips. to HEP   PT Home Exercise Plan prayer, lateral prayer, posterior capusule stretch with rotation, Mid throacic stretch over head; Sink stretch, tennis ball trigger point release; Scpa retraction, Scap extension Ball UE stabilization,  doorway stretch.   Consulted and Agree with Plan of Care Patient      Patient will benefit from skilled therapeutic intervention in order to improve the following deficits and impairments:  Decreased activity tolerance, Decreased strength, Increased muscle spasms, Impaired UE functional use, Pain  Visit Diagnosis: Pain in thoracic spine  Other muscle spasm  Muscle weakness (generalized)     Problem List Patient Active Problem List   Diagnosis Date Noted  . Pain in thoracic spine 04/09/2017  . Class 1 obesity without serious comorbidity with body mass index (BMI) of 34.0 to 34.9 in adult 01/12/2017    HH[facility] CenterPTA 04/22/2017, 1:27 PM  [facility] Center144 [address]GL[place] [address]Phone: [phone]  Fax:  [phone] Name: Julie SHEERANMRN: [id number] of Birth: 110-03-1976

## 2017-04-22 NOTE — Patient Instructions (Signed)
EX drawer: Ball shoulder stabilization 10 x each all issued daily or 3 x a week if using weights Doorway stretch 3 X 30-10 seconds

## 2017-04-29 ENCOUNTER — Ambulatory Visit: Payer: No Typology Code available for payment source | Attending: Orthopedic Surgery | Admitting: Physical Therapy

## 2017-04-29 ENCOUNTER — Encounter: Payer: Self-pay | Admitting: Physical Therapy

## 2017-04-29 DIAGNOSIS — M6281 Muscle weakness (generalized): Secondary | ICD-10-CM | POA: Insufficient documentation

## 2017-04-29 DIAGNOSIS — M546 Pain in thoracic spine: Secondary | ICD-10-CM

## 2017-04-29 DIAGNOSIS — M62838 Other muscle spasm: Secondary | ICD-10-CM | POA: Insufficient documentation

## 2017-04-29 NOTE — Therapy (Signed)
[place], [place], [address] Phone: [phone]   Fax:  [phone]  Physical Therapy Treatment  Patient Details  Name: Julie Love MRN: [id number] Date of Birth: October 06, 1975 Referring Provider: Dr Meredith Pel   Encounter Date: 04/29/2017      PT End of Session - 04/29/17 1450    Visit Number 9   Number of Visits 16   Date for PT Re-Evaluation 04/24/17   Authorization Type Unkown    PT Start Time 1417   PT Stop Time 1511   PT Time Calculation (min) 54 min   Activity Tolerance Patient tolerated treatment well   Behavior During Therapy [facility] for tasks assessed/performed      Past Medical History:  Diagnosis Date  . GERD (gastroesophageal reflux disease)     Past Surgical History:  Procedure Laterality Date  . CESAREAN SECTION    . FOOT SURGERY Right   . TUBAL LIGATION      There were no vitals filed for this visit.      Subjective Assessment - 04/29/17 1448    Subjective Patient feels like overall she is improving but yesterday she was sitting when she had significant pain in her mid throacic/ rhomboid area. She has been doing her exercises and stretches.    Limitations House hold activities   Diagnostic tests Thoracic x-ray: (-) for acute    Patient Stated Goals To have less pain when working    Currently in Pain? Yes   Pain Score 6    Pain Location Back   Pain Orientation Left;Mid;Upper   Pain Descriptors / Indicators Aching;Constant   Pain Type Chronic pain   Pain Onset More than a month ago   Pain Frequency Constant   Aggravating Factors  lifting    Pain Relieving Factors exercises, postural changes    Effect of Pain on Daily Activities difficulty perfroming work tasks    Multiple Pain Sites No                         [facility] Adult PT Treatment/Exercise - 04/29/17 0001      Shoulder Exercises: Supine   Other Supine Exercises supine scapular  stabilization with green band,  ER,  sash, horizontal abduction, narrow grip shoulder flexionand ER.  5 X each, cues for technique. no pain increase.     Shoulder Exercises: Prone   Other Prone Exercises Ball quadriped UE stab.    Rotation, elbow lift and alternate flexion/extension  10 X       Shoulder Exercises: Stretch   Corner Stretch 3 reps;10 seconds   Corner Stretch Limitations hands at shoulders using doorway  HEP   Other Shoulder Stretches posterior capsule stretch 2x30 sec hold      Manual Therapy   Manual therapy comments trigger point release to medial scpaualr border/ Thoraic area to reduce inflammtion. Cross hand Grade III throacic manipulation.  Softtissue mobilization around the area TPDN perfromed.                 PT Education - 04/29/17 1450    Education provided Yes   Education Details HEP   Person(s) Educated Patient   Methods Explanation;Demonstration;Tactile cues;Verbal cues   Comprehension Verbalized understanding;Returned demonstration          PT Short Term Goals - 04/14/17 1124      PT SHORT TERM GOAL #1   Title Patient will demsotrate decreased spasiming of the  medial scapualr border    Time 4   Period Weeks   Status Unable to assess     PT SHORT TERM GOAL #2   Title Patient will be independent with inital HEP for postrual correction and scpaular strength    Baseline independent with exercises.  She is also compliant.     Time 4   Period Weeks   Status Achieved     PT SHORT TERM GOAL #3   Title Patient will demsotrate full throacic rotation and extension without a significant increase in pain    Baseline Able to do sitting with YOGA stretch no pain   Time 4   Period Weeks   Status Partially Met     PT SHORT TERM GOAL #4   Title Patient will increase gross right scpaular strength to 4+/5    Time 4   Period Weeks   Status Unable to assess           PT Long Term Goals - 04/29/17 1620      PT LONG TERM GOAL #1   Title  Patient will return to home exercises including planks and other high level core work without increased pain    Baseline has not begun    Time 8   Period Weeks   Status On-going     PT LONG TERM GOAL #2   Title Patient will lift a 10 lb box 10x without increased pain in order to perfrom work tasks    Time 8   Period Weeks   Status On-going     PT LONG TERM GOAL #3   Title Patient will demsotrate a 19% limitation on FOTO    Time 8   Period Weeks               Plan - 04/29/17 1451    Clinical Impression Statement Therapy performed on trigger point dry needling of the rhombids. Minor twictch response noted. Improved symptoms after treatment but results continue to be intermittent. She countinues to have spasming after manual therapy. She has been working on her exercises at home.    Clinical Presentation Evolving   Clinical Decision Making Low   Rehab Potential Good   PT Frequency 2x / week   PT Duration 8 weeks   PT Treatment/Interventions ADLs/Self Care Home Management;Cryotherapy;Electrical Stimulation;Iontophoresis 13m/ml Dexamethasone;Gait training;Stair training;Moist Heat;Ultrasound;Therapeutic activities;Therapeutic exercise;Patient/family education;Passive range of motion;Manual techniques;Dry needling;Splinting;Taping;Neuromuscular re-education   PT Next Visit Plan Continue with TPDN; continue with exercises. Review quadriped ball stabilization for UE .   review Door way stretch and YOGA stretches sitting for trunk rotation starting at hips. to HEP   PT Home Exercise Plan prayer, lateral prayer, posterior capusule stretch with rotation, Mid throacic stretch over head; Sink stretch, tennis ball trigger point release; Scpa retraction, Scap extension Ball UE stabilization,  doorway stretch.   Consulted and Agree with Plan of Care Patient      Patient will benefit from skilled therapeutic intervention in order to improve the following deficits and impairments:  Decreased  activity tolerance, Decreased strength, Increased muscle spasms, Impaired UE functional use, Pain  Visit Diagnosis: Pain in thoracic spine  Other muscle spasm  Muscle weakness (generalized)     Problem List Patient Active Problem List   Diagnosis Date Noted  . Pain in thoracic spine 04/09/2017  . Class 1 obesity without serious comorbidity with body mass index (BMI) of 34.0 to 34.9 in adult 01/12/2017    DCarney LivingPT DPT  04/29/2017,  [address], [place], [address] Phone: [phone]   Fax:  [phone]  Name: Julie Love MRN: [id number] Date of Birth: 06-Nov-1975

## 2017-05-06 ENCOUNTER — Ambulatory Visit (INDEPENDENT_AMBULATORY_CARE_PROVIDER_SITE_OTHER): Payer: No Typology Code available for payment source | Admitting: Physical Medicine and Rehabilitation

## 2017-05-06 ENCOUNTER — Encounter (INDEPENDENT_AMBULATORY_CARE_PROVIDER_SITE_OTHER): Payer: Self-pay | Admitting: Physical Medicine and Rehabilitation

## 2017-05-06 VITALS — BP 129/85 | HR 58

## 2017-05-06 DIAGNOSIS — M546 Pain in thoracic spine: Secondary | ICD-10-CM | POA: Diagnosis not present

## 2017-05-06 DIAGNOSIS — M609 Myositis, unspecified: Secondary | ICD-10-CM | POA: Diagnosis not present

## 2017-05-06 DIAGNOSIS — G8929 Other chronic pain: Secondary | ICD-10-CM
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
## 2017-05-08 ENCOUNTER — Encounter (INDEPENDENT_AMBULATORY_CARE_PROVIDER_SITE_OTHER): Payer: Self-pay | Admitting: Physical Medicine and Rehabilitation

## 2017-05-08 ENCOUNTER — Ambulatory Visit: Payer: No Typology Code available for payment source | Admitting: Physical Therapy

## 2017-05-08 DIAGNOSIS — M62838 Other muscle spasm: Secondary | ICD-10-CM

## 2017-05-08 DIAGNOSIS — M6281 Muscle weakness (generalized): Secondary | ICD-10-CM

## 2017-05-08 DIAGNOSIS — M546 Pain in thoracic spine: Secondary | ICD-10-CM
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
## 2017-05-11 NOTE — Therapy (Signed)
[place], [place], [address] Phone: [phone]   Fax:  [phone]  Physical Therapy Treatment  Patient Details  Name: Julie Love MRN: [id number] Date of Birth: November 15, 1975 Referring Provider: Dr Meredith Pel   Encounter Date: 05/08/2017    Past Medical History:  Diagnosis Date  . GERD (gastroesophageal reflux disease)     Past Surgical History:  Procedure Laterality Date  . CESAREAN SECTION    . FOOT SURGERY Right   . TUBAL LIGATION      There were no vitals filed for this visit.      Subjective Assessment - 05/11/17 1407    Subjective Patient reports her pain is improving. She continues to have pain when she works. She will be having an injection on Wednesday.    Limitations House hold activities   Diagnostic tests Thoracic x-ray: (-) for acute    Patient Stated Goals To have less pain when working    Currently in Pain? Yes   Pain Score 4    Pain Location Back   Pain Orientation Mid;Upper;Left   Pain Descriptors / Indicators Aching   Pain Type Chronic pain   Pain Onset More than a month ago   Pain Frequency Constant   Aggravating Factors  lifting    Pain Relieving Factors exercises an postural changes    Effect of Pain on Daily Activities difficulty perfroming work tasks    Multiple Pain Sites No                         [facility] Adult PT Treatment/Exercise - 05/11/17 0001      Shoulder Exercises: Supine   Other Supine Exercises supine scapular stabilization with green band,  ER,  sash, horizontal abduction, narrow grip shoulder flexionand ER.  x10 each, cues for technique. no pain increase.     Shoulder Exercises: Prone   Other Prone Exercises quadruped alt ue/lle    Rotation, elbow lift and alternate flexion/extension  10 X       Shoulder Exercises: Stretch   Corner Stretch 3 reps;10 seconds   Corner Stretch Limitations hands at shoulders using doorway   HEP   Other Shoulder Stretches posterior capsule stretch 2x30 sec hold      Manual Therapy   Manual therapy comments trigger point release to medial scpaualr border/ Thoraic area to reduce inflammtion. Cross hand Grade II throacic manipulation.  Softtissue mobilization around the area TPDN perfromed.           Trigger Point Dry Needling - 05/11/17 1412    Consent Given? Yes   Education Handout Provided Yes   Muscles Treated Upper Body --  t-3/t-4 thoracic paraspinals good twitch response noted               PT Education - 05/11/17 1415    Education provided Yes   Education Details reviewed HEP    Person(s) Educated Patient   Methods Explanation;Demonstration;Tactile cues;Verbal cues   Comprehension Verbalized understanding;Returned demonstration          PT Short Term Goals - 05/11/17 1422      PT SHORT TERM GOAL #1   Title Patient will demsotrate decreased spasiming of the medial scapualr border    Baseline decreased per palpation. Assess carryover.    Time 4   Period Weeks   Status On-going     PT SHORT TERM GOAL #2   Title Patient will be independent with  inital HEP for postrual correction and scpaular strength    Baseline independent with exercises.  She is also compliant.     Time 4   Period Weeks   Status On-going     PT SHORT TERM GOAL #3   Title Patient will demsotrate full throacic rotation and extension without a significant increase in pain    Baseline Able to do sitting with YOGA stretch no pain   Time 4   Period Weeks   Status On-going     PT SHORT TERM GOAL #4   Title Patient will increase gross right scpaular strength to 4+/5    Time 4   Period Weeks   Status On-going           PT Long Term Goals - 04/29/17 1620      PT LONG TERM GOAL #1   Title Patient will return to home exercises including planks and other high level core work without increased pain    Baseline has not begun    Time 8   Period Weeks   Status On-going      PT LONG TERM GOAL #2   Title Patient will lift a 10 lb box 10x without increased pain in order to perfrom work tasks    Time 8   Period Weeks   Status On-going     PT LONG TERM GOAL #3   Title Patient will demsotrate a 19% limitation on FOTO    Time 8   Period Weeks               Plan - 05/11/17 1418    Clinical Impression Statement Patient continues to have pain working. She will have an injectionenext Tuesday. She was encouragd to continue with her exercises.    Clinical Presentation Evolving   Clinical Decision Making Low   Rehab Potential Good   PT Frequency 2x / week   PT Duration 8 weeks   PT Treatment/Interventions ADLs/Self Care Home Management;Cryotherapy;Electrical Stimulation;Iontophoresis 4mg /ml Dexamethasone;Gait training;Stair training;Moist Heat;Ultrasound;Therapeutic activities;Therapeutic exercise;Patient/family education;Passive range of motion;Manual techniques;Dry needling;Splinting;Taping;Neuromuscular re-education   PT Next Visit Plan Continue with TPDN; continue with exercises. Review quadriped ball stabilization for UE .   review Door way stretch and YOGA stretches sitting for trunk rotation starting at hips. to HEP   PT Home Exercise Plan prayer, lateral prayer, posterior capusule stretch with rotation, Mid throacic stretch over head; Sink stretch, tennis ball trigger point release; Scpa retraction, Scap extension Ball UE stabilization,  doorway stretch.   Consulted and Agree with Plan of Care Patient      Patient will benefit from skilled therapeutic intervention in order to improve the following deficits and impairments:  Decreased activity tolerance, Decreased strength, Increased muscle spasms, Impaired UE functional use, Pain  Visit Diagnosis: Pain in thoracic spine  Other muscle spasm  Muscle weakness (generalized)     Problem List Patient Active Problem List   Diagnosis Date Noted  . Pain in thoracic spine 04/09/2017  . Class 1  obesity without serious comorbidity with body mass index (BMI) of 34.0 to 34.9 in adult 01/12/2017    Carney Living PT DPT  05/11/2017, 2:23 PM  [facility] [address] [place], [place], [address] Phone: [phone]   Fax:  [phone]  Name: Julie Love MRN: [id number] Date of Birth: [birth year]

## 2017-05-12 ENCOUNTER — Ambulatory Visit (INDEPENDENT_AMBULATORY_CARE_PROVIDER_SITE_OTHER): Payer: No Typology Code available for payment source | Admitting: Physical Medicine and Rehabilitation

## 2017-05-12 ENCOUNTER — Ambulatory Visit: Payer: No Typology Code available for payment source | Admitting: Physical Therapy

## 2017-05-12 ENCOUNTER — Encounter (INDEPENDENT_AMBULATORY_CARE_PROVIDER_SITE_OTHER): Payer: Self-pay | Admitting: Physical Medicine and Rehabilitation

## 2017-05-12 ENCOUNTER — Ambulatory Visit (INDEPENDENT_AMBULATORY_CARE_PROVIDER_SITE_OTHER): Payer: No Typology Code available for payment source

## 2017-05-12 VITALS — BP 131/81 | HR 69 | Temp 98.0°F

## 2017-05-12 DIAGNOSIS — M62838 Other muscle spasm: Secondary | ICD-10-CM

## 2017-05-12 DIAGNOSIS — M546 Pain in thoracic spine: Secondary | ICD-10-CM

## 2017-05-12 DIAGNOSIS — G8929 Other chronic pain: Secondary | ICD-10-CM | POA: Diagnosis not present

## 2017-05-12 DIAGNOSIS — M47814 Spondylosis without myelopathy or radiculopathy, thoracic region: Secondary | ICD-10-CM

## 2017-05-12 DIAGNOSIS — M6281 Muscle weakness (generalized): Secondary | ICD-10-CM

## 2017-05-12 DIAGNOSIS — M609 Myositis, unspecified: Secondary | ICD-10-CM

## 2017-05-12 MED ORDER — LIDOCAINE HCL (PF) 1 % IJ SOLN
2.0000 mL | Freq: Once | INTRAMUSCULAR | Status: AC
  Administered 2017-05-12: 2 mL

## 2017-05-12 MED ORDER — METHYLPREDNISOLONE ACETATE 80 MG/ML IJ SUSP
80.0000 mg | Freq: Once | INTRAMUSCULAR | Status: AC
  Administered 2017-05-12: 80 mg

## 2017-05-12 NOTE — Progress Notes (Unsigned)
Fluoro Time: 39 sec Mgy: 47.68

## 2017-05-12 NOTE — Therapy (Signed)
[place], [place], [address] Phone: [phone]   Fax:  [phone]  Physical Therapy Treatment  Patient Details  Name: Julie Love MRN: [id number] Date of Birth: 05/22/1976 Referring Provider: Dr Meredith Pel   Encounter Date: 05/12/2017      PT End of Session - 05/12/17 1113    Visit Number 11   Number of Visits 16   Date for PT Re-Evaluation 06/23/17   Authorization Type Unkown    PT Start Time 1105   PT Stop Time 1145   PT Time Calculation (min) 40 min   Activity Tolerance Patient tolerated treatment well   Behavior During Therapy [facility] for tasks assessed/performed      Past Medical History:  Diagnosis Date  . GERD (gastroesophageal reflux disease)     Past Surgical History:  Procedure Laterality Date  . CESAREAN SECTION    . FOOT SURGERY Right   . TUBAL LIGATION      There were no vitals filed for this visit.      Subjective Assessment - 05/12/17 1106    Subjective Patient reports she is able to use her stretches to keep her pain under control. Her pain today after work is ablout a 5/10.    Limitations House hold activities   Diagnostic tests Thoracic x-ray: (-) for acute    Patient Stated Goals To have less pain when working    Currently in Pain? Yes   Pain Score 5    Pain Location Back   Pain Orientation Left;Upper;Mid   Pain Descriptors / Indicators Aching   Pain Type Chronic pain   Pain Onset More than a month ago   Pain Frequency Constant   Aggravating Factors  lifting    Pain Relieving Factors exercises and postural changes    Effect of Pain on Daily Activities difficulty performing work tasks                          [facility] Adult PT Treatment/Exercise - 05/12/17 0001      Lumbar Exercises: Sidelying   Other Sidelying Lumbar Exercises supine open book stretch 2x10 left and right      Shoulder Exercises: Supine   Other Supine Exercises  supine scapular stabilization with green band,  ER,  sash, horizontal abduction, narrow grip shoulder flexionand ER.  x10 each, cues for technique. no pain increase.     Shoulder Exercises: Prone   Other Prone Exercises Cat/ Camel 2x10; prayer stretch 3x20sec stretch; stretvch to the right 3x30sec hold; to the left not perfromed 2nd to pain    Other Prone Exercises quadruped alt ue/lle    Rotation, elbow lift and alternate flexion/extension  10 X       Shoulder Exercises: Standing   Other Standing Exercises standing shoulder flexion 2x10 2lb scaption 2x10      Manual Therapy   Manual Therapy Joint mobilization   Manual therapy comments trigger point release to medial scpaualr border/ Thoraic area to reduce inflammtion. Cross hand Grade II throacic manipulation.  Softtissue mobilization    Joint Mobilization unilateral PA glides on the left from t5-t-8 to improve throacic movement           Trigger Point Dry Needling - 05/11/17 1412    Consent Given? Yes   Education Handout Provided Yes   Muscles Treated Upper Body --  t-3/t-4 thoracic paraspinals good twitch response noted  PT Education - 05/12/17 1111    Education provided Yes   Education Details reviewed HEP    Person(s) Educated Patient   Methods Explanation;Demonstration;Tactile cues;Verbal cues   Comprehension Verbalized understanding;Returned demonstration          PT Short Term Goals - 05/12/17 1538      PT SHORT TERM GOAL #1   Title Patient will demsotrate decreased spasiming of the medial scapualr border    Baseline decreased per palpation. Assess carryover.    Time 4   Period Weeks   Status On-going     PT SHORT TERM GOAL #2   Title Patient will be independent with inital HEP for postrual correction and scpaular strength    Baseline independent with exercises.  She is also compliant.     Time 4   Period Weeks   Status On-going     PT SHORT TERM GOAL #3   Title Patient will demsotrate  full throacic rotation and extension without a significant increase in pain    Baseline improving motion with stretches   Time 4   Period Weeks   Status On-going     PT SHORT TERM GOAL #4   Title Patient will increase gross right scpaular strength to 4+/5    Time 4   Period Weeks   Status On-going           PT Long Term Goals - 04/29/17 1620      PT LONG TERM GOAL #1   Title Patient will return to home exercises including planks and other high level core work without increased pain    Baseline has not begun    Time 8   Period Weeks   Status On-going     PT LONG TERM GOAL #2   Title Patient will lift a 10 lb box 10x without increased pain in order to perfrom work tasks    Time 8   Period Weeks   Status On-going     PT LONG TERM GOAL #3   Title Patient will demsotrate a 19% limitation on FOTO    Time 8   Period Weeks               Plan - 05/12/17 1114    Clinical Impression Statement Therapy worked on unilateral glides to reduce pressure on throacic nerve roots. She would benefit from further skilled therapy   History and Personal Factors relevant to plan of care: nothing significant    Clinical Presentation Evolving   Rehab Potential Good   PT Frequency 2x / week   PT Duration 8 weeks   PT Treatment/Interventions ADLs/Self Care Home Management;Cryotherapy;Electrical Stimulation;Iontophoresis 4mg /ml Dexamethasone;Gait training;Stair training;Moist Heat;Ultrasound;Therapeutic activities;Therapeutic exercise;Patient/family education;Passive range of motion;Manual techniques;Dry needling;Splinting;Taping;Neuromuscular re-education   PT Next Visit Plan Continue with TPDN; continue with exercises. Review quadriped ball stabilization for UE .   review Door way stretch and YOGA stretches sitting for trunk rotation starting at hips. to HEP   PT Home Exercise Plan prayer, lateral prayer, posterior capusule stretch with rotation, Mid throacic stretch over head; Sink  stretch, tennis ball trigger point release; Scpa retraction, Scap extension Ball UE stabilization,  doorway stretch.   Consulted and Agree with Plan of Care Patient      Patient will benefit from skilled therapeutic intervention in order to improve the following deficits and impairments:  Decreased activity tolerance, Decreased strength, Increased muscle spasms, Impaired UE functional use, Pain  Visit Diagnosis: Pain in thoracic spine  Other muscle spasm  Muscle weakness (generalized)     Problem List Patient Active Problem List   Diagnosis Date Noted  . Pain in thoracic spine 04/09/2017  . Class 1 obesity without serious comorbidity with body mass index (BMI) of 34.0 to 34.9 in adult 01/12/2017    Carney Living PT DPT  05/12/2017, 3:48 PM  [facility] [address] [place], [place], [address] Phone: [phone]   Fax:  [phone]  Name: Julie Love MRN: [id number] Date of Birth: 12-19-1975

## 2017-05-12 NOTE — Patient Instructions (Signed)

## 2017-05-12 NOTE — Progress Notes (Deleted)
Patient is here today for 2 level t-spine facet joint block. No change in symptoms.

## 2017-05-14 NOTE — Procedures (Signed)
Julie Love is a very pleasant 41 year old female that we recently saw for evaluation and management through Dr. Marlou Sa. She has failed conservative care including medication management and time as well as physical therapy with dry needling. She continues to have thoracic back pain. MRI of the thoracic spine was really unrevealing. We are going to complete diagnostic facet joint blocks at approximately the T7-8, T8-9 levels based on the area of her pain.  Thoracic Diagnostic Facet Joint Nerve Block with Fluoroscopic Guidance  Patient: Julie Love      Date of Birth: November 21, 1975 MRN: [id number] PCP: Martinique, Betty G, MD      Visit Date: 05/12/2017   Universal Protocol:    Date/Time: 08/16/186:04 AM  Consent Given By: the patient  Position: prone  Additional Comments: Vital signs were monitored before and after the procedure. Patient was prepped and draped in the usual sterile fashion. The correct patient, procedure, and site was verified.   Injection Procedure Details:  Procedure Site One Meds Administered:  Meds ordered this encounter  Medications  . lidocaine (PF) (XYLOCAINE) 1 % injection 2 mL  . methylPREDNISolone acetate (DEPO-MEDROL) injection 80 mg     Laterality: Bilateral  Location/Site:  T7-8 T8-9  Needle size: 25 G  Needle type: spinal needle  Needle Placement: over the pedical shadow  Findings:  -Contrast Used: 1 mL iohexol 180 mg iodine/mL   -Comments: Excellent flow of contrast producing a partial arthrogram.  Procedure Details: The fluoroscope beam is vertically oriented and tilted cranially and caudally to square off the vertebral body endplates to achieve a true AP midline view.  The skin over the target area of the pedicle shadow ipsilateral to the desired medial branch nerve was locally anesthetized with a 1 ml volume of 1% Lidocaine without Epinephrine.  A 22 gauge spinal needle was inserted and advanced in a trajectory view down to the target.    After contact with periosteum and negative aspirate for blood and CSF, correct placement without intravascular or epidural spread was confirmed by injecting 0.5 ml. of Omnipaque-240.  A spot radiograph was obtained of this image.  Next, a 0.5 ml. volume of the injectate described above was injected. The needle was then redirected to the other facet joint nerves mentioned above if needed.  Prior to the procedure, the patient was given a Pain Diary which was completed for baseline measurements.  After the procedure, the patient rated their pain every 30 minutes and will continue rating at this frequency for a total of 5 hours.  The patient has been asked to complete the Diary and return to [place] by mail, fax or hand delivered as soon as possible.   Additional Comments:  The patient tolerated the procedure well Dressing: Band-Aid    Post-procedure details: Patient was observed during the procedure. Post-procedure instructions were reviewed. Patient left the clinic in stable condition.

## 2017-05-19 ENCOUNTER — Ambulatory Visit: Payer: No Typology Code available for payment source | Admitting: Physical Therapy

## 2017-05-19 ENCOUNTER — Encounter: Payer: Self-pay | Admitting: Physical Therapy

## 2017-05-19 DIAGNOSIS — M6281 Muscle weakness (generalized): Secondary | ICD-10-CM

## 2017-05-19 DIAGNOSIS — M62838 Other muscle spasm: Secondary | ICD-10-CM

## 2017-05-19 DIAGNOSIS — M546 Pain in thoracic spine: Secondary | ICD-10-CM

## 2017-05-19 NOTE — Therapy (Signed)
[place], [place], [address] Phone: [phone]   Fax:  [phone]  Physical Therapy Treatment  Patient Details  Name: Julie Love MRN: [id number] Date of Birth: 1975-11-20 Referring Provider: Dr Meredith Pel   Encounter Date: 05/19/2017      PT End of Session - 05/19/17 1111    Visit Number 12   Number of Visits 16   Date for PT Re-Evaluation 06/23/17   Authorization Type Unkown    PT Start Time 1104   PT Stop Time 1143   PT Time Calculation (min) 39 min   Activity Tolerance Patient tolerated treatment well;Treatment limited secondary to medical complications (Comment)   Behavior During Therapy [facility] for tasks assessed/performed      Past Medical History:  Diagnosis Date  . GERD (gastroesophageal reflux disease)     Past Surgical History:  Procedure Laterality Date  . CESAREAN SECTION    . FOOT SURGERY Right   . TUBAL LIGATION      There were no vitals filed for this visit.      Subjective Assessment - 05/19/17 1109    Subjective Patient reports since the injections she has had no pain. She has been back to work without pain.    Limitations House hold activities   Diagnostic tests Thoracic x-ray: (-) for acute    Patient Stated Goals To have less pain when working    Currently in Pain? No/denies                         [facility] Adult PT Treatment/Exercise - 05/19/17 0001      Lumbar Exercises: Sidelying   Other Sidelying Lumbar Exercises supine open book stretch 2x10 left and right      Shoulder Exercises: Supine   Other Supine Exercises supine scapular stabilization with green band,  ER,  sash, horizontal abduction, narrow grip shoulder flexionand ER. 2 x10 each, cues for technique. no pain increase.     Shoulder Exercises: Standing   Other Standing Exercises standing shoulder flexion 2x10 2lb scaption 2x10      Shoulder Exercises: Stretch   Corner  Stretch 3 reps;10 seconds   Corner Stretch Limitations hands at shoulders using doorway  HEP   Other Shoulder Stretches posterior capsule stretch 2x30 sec hold                 PT Education - 05/19/17 1111    Education provided Yes   Education Details reviewed HEP    Person(s) Educated Patient   Methods Explanation;Demonstration;Tactile cues;Verbal cues   Comprehension Verbalized understanding;Returned demonstration          PT Short Term Goals - 05/19/17 1500      PT SHORT TERM GOAL #1   Title Patient will demsotrate decreased spasiming of the medial scapualr border    Baseline decreased per palpation. Assess carryover.    Time 4   Period Weeks   Status On-going     PT SHORT TERM GOAL #2   Title Patient will be independent with inital HEP for postrual correction and scpaular strength    Baseline independent with exercises.  She is also compliant.     Time 4   Period Weeks   Status On-going     PT SHORT TERM GOAL #3   Title Patient will demsotrate full throacic rotation and extension without a significant increase in pain    Baseline improving motion with stretches  Time 4   Period Weeks   Status On-going     PT SHORT TERM GOAL #4   Title Patient will increase gross right scpaular strength to 4+/5    Time 4   Period Weeks   Status On-going           PT Long Term Goals - 04/29/17 1620      PT LONG TERM GOAL #1   Title Patient will return to home exercises including planks and other high level core work without increased pain    Baseline has not begun    Time 8   Period Weeks   Status On-going     PT LONG TERM GOAL #2   Title Patient will lift a 10 lb box 10x without increased pain in order to perfrom work tasks    Time 8   Period Weeks   Status On-going     PT LONG TERM GOAL #3   Title Patient will demsotrate a 19% limitation on FOTO    Time 8   Period Weeks               Plan - 05/19/17 1459    Clinical Impression Statement  Patient had no pain with treatment. she was able to perfrom all exercises without difficulty. Nunzio Cory been working without pain. If she continues to have no pain she will discharge next viist.    Clinical Presentation Evolving   Clinical Decision Making Low   Rehab Potential Good   PT Frequency 2x / week   PT Duration 8 weeks   PT Treatment/Interventions ADLs/Self Care Home Management;Cryotherapy;Electrical Stimulation;Iontophoresis 4mg /ml Dexamethasone;Gait training;Stair training;Moist Heat;Ultrasound;Therapeutic activities;Therapeutic exercise;Patient/family education;Passive range of motion;Manual techniques;Dry needling;Splinting;Taping;Neuromuscular re-education   PT Next Visit Plan Continue with TPDN; continue with exercises. Review quadriped ball stabilization for UE .   review Door way stretch and YOGA stretches sitting for trunk rotation starting at hips. to HEP   PT Home Exercise Plan prayer, lateral prayer, posterior capusule stretch with rotation, Mid throacic stretch over head; Sink stretch, tennis ball trigger point release; Scpa retraction, Scap extension Ball UE stabilization,  doorway stretch.   Consulted and Agree with Plan of Care Patient      Patient will benefit from skilled therapeutic intervention in order to improve the following deficits and impairments:  Decreased activity tolerance, Decreased strength, Increased muscle spasms, Impaired UE functional use, Pain  Visit Diagnosis: Pain in thoracic spine  Other muscle spasm  Muscle weakness (generalized)     Problem List Patient Active Problem List   Diagnosis Date Noted  . Pain in thoracic spine 04/09/2017  . Class 1 obesity without serious comorbidity with body mass index (BMI) of 34.0 to 34.9 in adult 01/12/2017    Carney Living PT DPT  05/19/2017, 3:02 PM  [facility] [address] [place], [place], [address] Phone: [phone]   Fax:   [phone]  Name: Julie Love MRN: [id number] Date of Birth: 1976/07/18

## 2017-05-26 ENCOUNTER — Ambulatory Visit: Payer: No Typology Code available for payment source | Admitting: Physical Therapy

## 2017-05-26 DIAGNOSIS — M6281 Muscle weakness (generalized): Secondary | ICD-10-CM

## 2017-05-26 DIAGNOSIS — M62838 Other muscle spasm: Secondary | ICD-10-CM

## 2017-05-26 DIAGNOSIS — M546 Pain in thoracic spine: Secondary | ICD-10-CM

## 2017-05-26 NOTE — Therapy (Signed)
Julie Love, [place], [address] Phone: [phone]   Fax:  [phone]  Physical Therapy Treatment/ Discharge   Patient Details  Name: Julie Love MRN: [id number] Date of Birth: 06-10-1976 Referring Provider: Dr Meredith Pel   Encounter Date: 05/26/2017      PT End of Session - 05/26/17 2114    Visit Number 13   Number of Visits 16   Date for PT Re-Evaluation 06/23/17   Authorization Type Unkown    PT Start Time 1106   PT Stop Time 1145   PT Time Calculation (min) 39 min   Activity Tolerance Patient tolerated treatment well   Behavior During Therapy [facility] for tasks assessed/performed      Past Medical History:  Diagnosis Date  . GERD (gastroesophageal reflux disease)     Past Surgical History:  Procedure Laterality Date  . CESAREAN SECTION    . FOOT SURGERY Right   . TUBAL LIGATION      There were no vitals filed for this visit.      Subjective Assessment - 05/26/17 2113    Subjective No pain at this time. Working full time without pain since her shot. D/C to HEP at this time.    Limitations House hold activities   Diagnostic tests Thoracic x-ray: (-) for acute    Patient Stated Goals To have less pain when working    Currently in Pain? No/denies                         [facility] Adult PT Treatment/Exercise - 05/26/17 0001      Lumbar Exercises: Sidelying   Other Sidelying Lumbar Exercises supine open book stretch 2x10 left and right      Shoulder Exercises: Supine   Other Supine Exercises supine scapular stabilization with green band,  ER,  sash, horizontal abduction, narrow grip shoulder flexionand ER. 2 x10 each, cues for technique. no pain increase.     Shoulder Exercises: Standing   Other Standing Exercises standing shoulder flexion 2x10 2lb scaption 2x10      Shoulder Exercises: Stretch   Corner Stretch 3 reps;10 seconds   Corner Stretch Limitations  hands at shoulders using doorway  HEP   Other Shoulder Stretches posterior capsule stretch 2x30 sec hold                 PT Education - 05/26/17 2113    Education provided Yes   Education Details reviewed HEP    Person(s) Educated Patient   Methods Explanation;Demonstration;Tactile cues;Verbal cues   Comprehension Verbalized understanding;Returned demonstration          PT Short Term Goals - 05/26/17 2123      PT SHORT TERM GOAL #1   Title Patient will demsotrate decreased spasiming of the medial scapualr border    Baseline decreased per palpation. Assess carryover.    Time 4   Period Weeks   Status Achieved     PT SHORT TERM GOAL #2   Title Patient will be independent with inital HEP for postrual correction and scpaular strength    Baseline independent with exercises.  She is also compliant.     Time 4   Period Weeks   Status On-going     PT SHORT TERM GOAL #3   Title Patient will demsotrate full throacic rotation and extension without a significant increase in pain    Baseline full thoracic mobility    Time  4   Period Weeks   Status On-going     PT SHORT TERM GOAL #4   Title Patient will increase gross right scpaular strength to 4+/5    Time 4   Period Weeks   Status Achieved           PT Long Term Goals - 05/26/17 2123      PT LONG TERM GOAL #1   Title Patient will return to home exercises including planks and other high level core work without increased pain    Baseline has not begun    Time 8   Period Weeks   Status Achieved     PT LONG TERM GOAL #2   Title Patient will lift a 10 lb box 10x without increased pain in order to perfrom work tasks    Baseline lifting boxes without difficulty at work    Time 8   Period Weeks   Status Achieved     PT [place] #3   Title Patient will demsotrate a 19% limitation on FOTO    Time 8   Period Weeks   Status Achieved               Plan - 05/26/17 2115    Clinical Impression  Statement Patient has made great progress. She has no  pain after her shot. She was encouraged to continue her exercises.    Clinical Presentation Evolving   Clinical Decision Making Low   Rehab Potential Good   PT Frequency 2x / week   PT Duration 8 weeks   PT Treatment/Interventions ADLs/Self Care Home Management;Cryotherapy;Electrical Stimulation;Iontophoresis 86m/ml Dexamethasone;Gait training;Stair training;Moist Heat;Ultrasound;Therapeutic activities;Therapeutic exercise;Patient/family education;Passive range of motion;Manual techniques;Dry needling;Splinting;Taping;Neuromuscular re-education   PT Next Visit Plan D/C to HEP    PT Home Exercise Plan prayer, lateral prayer, posterior capusule stretch with rotation, Mid throacic stretch over head; Sink stretch, tennis ball trigger point release; Scpa retraction, Scap extension Ball UE stabilization,  doorway stretch.   Consulted and Agree with Plan of Care Patient      Patient will benefit from skilled therapeutic intervention in order to improve the following deficits and impairments:  Decreased activity tolerance, Decreased strength, Increased muscle spasms, Impaired UE functional use, Pain  Visit Diagnosis: Pain in thoracic spine  Other muscle spasm  Muscle weakness (generalized)  PHYSICAL THERAPY DISCHARGE SUMMARY  Visits from Start of Care: 13  Current functional level related to goals / functional outcomes: No pain at work    Remaining deficits: None    Education / Equipment: HEP  Plan: Patient agrees to discharge.  Patient goals were not met. Patient is being discharged due to meeting the stated rehab goals.  ?????       Problem List Patient Active Problem List   Diagnosis Date Noted  . Pain in thoracic spine 04/09/2017  . Class 1 obesity without serious comorbidity with body mass index (BMI) of 34.0 to 34.9 in adult 01/12/2017     DCarney LivingPT DPT  05/26/2017, 9:25 PM  [facility]GRice [place] [address]Phone: [phone]  Fax:  [phone] Name: Julie GERMANOMRN: [id number] of Birth: [birth year]

## 2017-08-13 ENCOUNTER — Encounter: Payer: Self-pay | Admitting: Family Medicine

## 2017-09-18 ENCOUNTER — Telehealth: Payer: Self-pay | Admitting: Family Medicine

## 2017-09-18 NOTE — Telephone Encounter (Signed)
Copied from [place] [id number]. Topic: General - Other >> Sep 18, 2017 10:21 AM Arletha Grippe wrote: Reason for [id number] with [employer] will be sending form that needs to be filled out. Cb # [id number] order nu [id number]

## 2017-10-07 ENCOUNTER — Ambulatory Visit: Payer: 59 | Admitting: Family Medicine

## 2017-10-07 ENCOUNTER — Other Ambulatory Visit (HOSPITAL_COMMUNITY)
Admission: RE | Admit: 2017-10-07 | Discharge: 2017-10-07 | Disposition: A | Discharge status: Home / Self Care | Payer: 59 | Source: Ambulatory Visit | Attending: Family Medicine | Admitting: Family Medicine

## 2017-10-07 ENCOUNTER — Encounter: Payer: Self-pay | Admitting: Family Medicine

## 2017-10-07 VITALS — BP 125/88 | HR 71 | Temp 98.5°F | Resp 12 | Ht 67.0 in | Wt 214.0 lb

## 2017-10-07 DIAGNOSIS — A5909 Other urogenital trichomoniasis: Secondary | ICD-10-CM | POA: Insufficient documentation

## 2017-10-07 DIAGNOSIS — N898 Other specified noninflammatory disorders of vagina: Secondary | ICD-10-CM | POA: Diagnosis not present

## 2017-10-07 DIAGNOSIS — Z202 Contact with and (suspected) exposure to infections with a predominantly sexual mode of transmission: Secondary | ICD-10-CM | POA: Insufficient documentation

## 2017-10-07 MED ORDER — METRONIDAZOLE 500 MG PO TABS: 500.0000 mg | ORAL_TABLET | Freq: Two times a day (BID) | ORAL | 0 refills | Status: DC

## 2017-10-07 NOTE — Patient Instructions (Signed)
A few things to remember from today's visit:   Exposure to trichomonas - Plan: metroNIDAZOLE (FLAGYL) 500 MG tablet, Urine cytology ancillary only  Trichomonas Test Why am I having this test? The trichomonas test is done to diagnose trichomoniasis, an infection caused by an organism called Trichomonas. Trichomoniasis is a sexually transmitted infection (STI). In women, it causes vaginal infections. In men, it can cause the tube that carries urine (urethra) to become inflamed (urethritis). You may have this test as a part of a routine screening for STIs or if you have symptoms of trichomoniasis. To perform the test, your health care provider will take a sample of discharge. The sample is taken from the vagina or cervix in women and from the urethra in men. A urine sample can also be used for testing. What do the results mean? It is your responsibility to obtain your test results. Ask the lab or department performing the test when and how you will get your results. Contact your health care provider to discuss any questions you have about your results. Negative test results A negative test means you do not have trichomoniasis. Follow your health care provider's directions about any follow-up testing. Positive test results A positive test result means you have an active infection that needs to be treated with antibiotic medicine. All your current sexual partners must also be treated or it is likely you will get reinfected. If your test is positive, your health care provider will start you on medicine and may advise you to:  Not have sexual intercourse until your infection has cleared up.  Use a latex condom properly every time you have sexual intercourse.  Limit the number of sexual partners you have. The more partners you have, the greater your risk of contracting trichomoniasis or another STI.  Tell all sexual partners about your infection so they can also be treated and to prevent  reinfection.  Talk with your health care provider to discuss your results, treatment options, and if necessary, the need for more tests. Talk with your health care provider if you have any questions about your results. This information is not intended to replace advice given to you by your health care provider. Make sure you discuss any questions you have with your health care provider. Document Released: 10/18/2004 Document Revised: 04/16/2016 Document Reviewed: 09/27/2013 Elsevier Interactive Patient Education  2017 [place].  Please be sure medication list is accurate. If a new problem present, please set up appointment sooner than planned today.

## 2017-10-07 NOTE — Telephone Encounter (Signed)
Patient isn't transferring. Haven't seen any paperwork from [employer].

## 2017-10-07 NOTE — Telephone Encounter (Signed)
It is fine with me

## 2017-10-07 NOTE — Progress Notes (Signed)
ACUTE VISIT   HPI:  Chief Complaint  Patient presents with  . Exposure to STD    partner stated that he had Trich, 2 or 3 days ago had some tingling with urination and look like bumps were in vaginal area    Ms.MEGANN EASTERWOOD is a 42 y.o. female, who is here today complaining of 2-3 days of whitish vaginal discharge and pruritus.  She also noted a "bump" a couple days ago when she was wiping, lesion has disappeared. "Tingling" sensation with urination. She denies dysuria, urinary frequency, urgency, or gross hematuria. She has no noted pelvic pain or abnormal vaginal bleeding. No past history of STDs.  Her boyfriend, and only sex partner, has been recently diagnosed with trichomonas.   Review of Systems  Constitutional: Negative for activity change, appetite change, fatigue and fever.  HENT: Negative for mouth sores and sore throat.   Gastrointestinal: Negative for abdominal pain, constipation, diarrhea, nausea and vomiting.  Genitourinary: Positive for vaginal discharge. Negative for decreased urine volume, dyspareunia, dysuria, hematuria and vaginal bleeding.  Musculoskeletal: Negative for back pain and myalgias.  Skin: Negative for rash and wound.  Psychiatric/Behavioral: Negative for confusion. The patient is nervous/anxious.       Current Outpatient Medications on File Prior to Visit  Medication Sig Dispense Refill  . Ibuprofen-Famotidine (DUEXIS) 800-26.6 MG TABS 1 tablet as needed for pain qd-bid (Patient not taking: Reported on 10/07/2017) 60 tablet 0  . meloxicam (MOBIC) 7.5 MG tablet Take 1-2 tablets (7.5-15 mg total) by mouth daily. For 5 days, then daily as needed for pain (Patient not taking: Reported on 02/26/2017) 30 tablet 0  . methocarbamol (ROBAXIN) 500 MG tablet Take 1 tablet (500 mg total) by mouth every 8 (eight) hours as needed for muscle spasms. (Patient not taking: Reported on 04/09/2017) 45 tablet 1  . terbinafine (LAMISIL) 250 MG tablet Take 1  tablet (250 mg total) by mouth daily. (Patient not taking: Reported on 05/06/2017) 45 tablet 0   No current facility-administered medications on file prior to visit.      Past Medical History:  Diagnosis Date  . GERD (gastroesophageal reflux disease)    No Known Allergies  Social History   Socioeconomic History  . Marital status: Married    Spouse name: None  . Number of children: None  . Years of education: None  . Highest education level: None  Social Needs  . Financial resource strain: None  . Food insecurity - worry: None  . Food insecurity - inability: None  . Transportation needs - medical: None  . Transportation needs - non-medical: None  Occupational History  . None  Tobacco Use  . Smoking status: Never Smoker  . Smokeless tobacco: Never Used  Substance and Sexual Activity  . Alcohol use: No    Alcohol/week: 0.0 oz  . Drug use: No  . Sexual activity: None  Other Topics Concern  . None  Social History Narrative  . None    Vitals:   10/07/17 0908  BP: 125/88  Pulse: 71  Resp: 12  Temp: 98.5 F (36.9 C)  SpO2: 99%   Body mass index is 33.52 kg/m.  Physical Exam  Nursing note and vitals reviewed. Constitutional: She is oriented to person, place, and time. She appears well-developed. No distress.  HENT:  Head: Normocephalic and atraumatic.  Eyes: Conjunctivae are normal.  Cardiovascular: Normal rate and regular rhythm.  Respiratory: Effort normal and breath sounds normal. No respiratory distress.  GI: Soft. She exhibits no mass. There is no tenderness. There is no CVA tenderness.  Genitourinary: There is no rash, tenderness or lesion on the right labia. There is no rash, tenderness or lesion on the left labia. No erythema or bleeding in the vagina. No vaginal discharge found.  Musculoskeletal: She exhibits no edema or tenderness.  Lymphadenopathy:    She has no cervical adenopathy.  Neurological: She is alert and oriented to person, place, and time.  She has normal strength. Gait normal.  Skin: Skin is warm. No erythema.  Psychiatric: Her mood appears anxious.  Well-groomed, good eye contact.    ASSESSMENT AND PLAN:   Ms. Celsa was seen today for exposure to std.  Diagnoses and all orders for this visit:  Vaginal discharge  Possible etiologies discussed. Further recommendation will be given according to lab results.  Exposure to trichomonas  Educated about diagnosis. Side effects of Metronidazole discussed. Education about STD prevention given today.   -     metroNIDAZOLE (FLAGYL) 500 MG tablet; Take 1 tablet (500 mg total) by mouth 2 (two) times daily. -     Urine cytology ancillary only    -Ms.HAELEY FORDHAM was advised to seek immediate medical attention if sudden worsening symptoms or to follow if they persist or if new concerns arise.       Betty G. Martinique, MD  [facility]. [place] office.

## 2017-10-08 LAB — URINE CYTOLOGY ANCILLARY ONLY
Chlamydia: NEGATIVE
NEISSERIA GONORRHEA: NEGATIVE
Trichomonas: POSITIVE — AB

## 2018-03-08 ENCOUNTER — Encounter: Payer: Self-pay | Admitting: Family Medicine

## 2018-03-08 ENCOUNTER — Other Ambulatory Visit (HOSPITAL_COMMUNITY)
Admission: RE | Admit: 2018-03-08 | Discharge: 2018-03-08 | Disposition: A | Discharge status: Home / Self Care | Payer: 59 | Source: Ambulatory Visit | Attending: Family Medicine | Admitting: Family Medicine

## 2018-03-08 ENCOUNTER — Ambulatory Visit: Payer: 59 | Admitting: Family Medicine

## 2018-03-08 VITALS — BP 118/70 | HR 60 | Temp 98.5°F | Resp 12 | Ht 67.0 in | Wt 210.4 lb

## 2018-03-08 DIAGNOSIS — N898 Other specified noninflammatory disorders of vagina: Secondary | ICD-10-CM | POA: Insufficient documentation

## 2018-03-08 DIAGNOSIS — Z8619 Personal history of other infectious and parasitic diseases: Secondary | ICD-10-CM | POA: Insufficient documentation

## 2018-03-08 LAB — POCT URINALYSIS DIPSTICK
BILIRUBIN UA: NEGATIVE
Glucose, UA: NEGATIVE
KETONES UA: NEGATIVE
Leukocytes, UA: NEGATIVE
NITRITE UA: NEGATIVE
PH UA: 6 (ref 5.0–8.0)
PROTEIN UA: NEGATIVE
RBC UA: NEGATIVE
SPEC GRAV UA: 1.01 (ref 1.010–1.025)
UROBILINOGEN UA: 0.2 U/dL

## 2018-03-08 MED ORDER — TERCONAZOLE 0.4 % VA CREA: 1.0000 | TOPICAL_CREAM | Freq: Every day | VAGINAL | 0 refills | Status: AC

## 2018-03-08 NOTE — Patient Instructions (Addendum)
A few things to remember from today's visit:   Vaginal discharge - Plan: POC Urinalysis Dipstick, terconazole (TERAZOL 7) 0.4 % vaginal cream  Because you have itching 2 weeks ago I will treat as yeast infection. We will send urine for STDs.  Please be sure medication list is accurate. If a new problem present, please set up appointment sooner than planned today.

## 2018-03-08 NOTE — Progress Notes (Signed)
ACUTE VISIT   HPI:  Chief Complaint  Patient presents with  . Vaginal Discharge    started 4 days ago    [name] is a 42 y.o. female, who is here today complaining of 2 weeks of intermittent clear watery vaginal discharge, today thicker and white. 2 weeks ago she has some vaginal pruritus that lasted about 2 days. She has not noted abnormal vaginal bleeding. She has not been sexually active since January this year. She has not tried OTC medication.  Problem otherwise stable.  No fever, chills, abdominal pain, joint pain, or skin rash.  Earlier this year, 10/07/2017 urine cytology was positive for trichomonas.  She completed treatment with metronidazole.  Denies dysuria,increased urinary frequency, gross hematuria,or decreased urine output. LMP 03/01/18 She follows with gynecology regularly, Pap smear within the past 2 years, thinks it was around 08/2016.   Review of Systems  Constitutional: Negative for appetite change, chills and fever.  HENT: Negative for mouth sores and sore throat.   Gastrointestinal: Negative for abdominal pain, nausea and vomiting.  Genitourinary: Positive for vaginal discharge. Negative for decreased urine volume, dysuria, hematuria, menstrual problem, vaginal bleeding and vaginal pain.  Musculoskeletal: Negative for arthralgias and joint swelling.  Skin: Negative for rash and wound.      Current Outpatient Medications on File Prior to Visit  Medication Sig Dispense Refill  . Ibuprofen-Famotidine (DUEXIS) 800-26.6 MG TABS 1 tablet as needed for pain qd-bid (Patient not taking: Reported on 10/07/2017) 60 tablet 0  . meloxicam (MOBIC) 7.5 MG tablet Take 1-2 tablets (7.5-15 mg total) by mouth daily. For 5 days, then daily as needed for pain (Patient not taking: Reported on 02/26/2017) 30 tablet 0  . methocarbamol (ROBAXIN) 500 MG tablet Take 1 tablet (500 mg total) by mouth every 8 (eight) hours as needed for muscle spasms. (Patient not  taking: Reported on 04/09/2017) 45 tablet 1  . metroNIDAZOLE (FLAGYL) 500 MG tablet Take 1 tablet (500 mg total) by mouth 2 (two) times daily. (Patient not taking: Reported on 03/08/2018) 14 tablet 0  . terbinafine (LAMISIL) 250 MG tablet Take 1 tablet (250 mg total) by mouth daily. (Patient not taking: Reported on 05/06/2017) 45 tablet 0   No current facility-administered medications on file prior to visit.      Past Medical History:  Diagnosis Date  . GERD (gastroesophageal reflux disease)    No Known Allergies  Social History   Socioeconomic History  . Marital status: Married    Spouse name: Not on file  . Number of children: Not on file  . Years of education: Not on file  . Highest education level: Not on file  Occupational History  . Not on file  Social Needs  . Financial resource strain: Not on file  . Food insecurity:    Worry: Not on file    Inability: Not on file  . Transportation needs:    Medical: Not on file    Non-medical: Not on file  Tobacco Use  . Smoking status: Never Smoker  . Smokeless tobacco: Never Used  Substance and Sexual Activity  . Alcohol use: No    Alcohol/week: 0.0 oz  . Drug use: No  . Sexual activity: Not on file  Lifestyle  . Physical activity:    Days per week: Not on file    Minutes per session: Not on file  . Stress: Not on file  Relationships  . Social connections:    Talks on  phone: Not on file    Gets together: Not on file    Attends religious service: Not on file    Active member of club or organization: Not on file    Attends meetings of clubs or organizations: Not on file    Relationship status: Not on file  Other Topics Concern  . Not on file  Social History Narrative  . Not on file    Vitals:   03/08/18 1449  BP: 118/70  Pulse: 60  Resp: 12  Temp: 98.5 F (36.9 C)  SpO2: 99%   Body mass index is 32.95 kg/m.   Physical Exam  Nursing note and vitals reviewed. Constitutional: She is oriented to person,  place, and time. She appears well-developed. No distress.  HENT:  Head: Normocephalic and atraumatic.  Eyes: Conjunctivae are normal.  Cardiovascular: Normal rate and regular rhythm.  Respiratory: Effort normal. No respiratory distress.  GI: Soft. She exhibits no mass. There is no tenderness.  Genitourinary: There is no rash, tenderness or lesion on the right labia. There is no rash, tenderness or lesion on the left labia. Cervix exhibits discharge (whitish,no odorous discharge.). Cervix exhibits no motion tenderness and no friability. No erythema, tenderness or bleeding in the vagina. Vaginal discharge found.  Musculoskeletal: She exhibits no edema.  Lymphadenopathy:    She has no cervical adenopathy.  Neurological: She is alert and oriented to person, place, and time. She has normal strength. Gait normal.  Skin: Skin is warm. No rash noted. No erythema.  Psychiatric: She has a normal mood and affect.  Well-groomed, good eye contact.    ASSESSMENT AND PLAN:   [name] was seen today for vaginal discharge.  Diagnoses and all orders for this visit:  Vaginal discharge -     POC Urinalysis Dipstick -     terconazole (TERAZOL 7) 0.4 % vaginal cream; Place 1 applicator vaginally at bedtime for 7 days. -     Urine cytology ancillary only  History of trichomonal vaginitis -     Urine cytology ancillary only   Urine dipstick was done and negative. Possible etiology discussed, denies risk factor for trich re-infection. ?  Physiologic discharge. She agrees with empiric treatment for Candida vaginitis, Terazol vaginal cream recommended. Further recommendation will be given according to urine cytology results.   Return if symptoms worsen or fail to improve.     Taleah Bellantoni G. [place], MD  [facility]. [place] office.

## 2018-03-11 LAB — URINE CYTOLOGY ANCILLARY ONLY
CHLAMYDIA, DNA PROBE: NEGATIVE
NEISSERIA GONORRHEA: NEGATIVE
TRICH (WINDOWPATH): POSITIVE — AB

## 2018-03-12 ENCOUNTER — Other Ambulatory Visit: Payer: Self-pay | Admitting: *Deleted

## 2018-03-12 DIAGNOSIS — Z202 Contact with and (suspected) exposure to infections with a predominantly sexual mode of transmission: Secondary | ICD-10-CM

## 2018-03-12 MED ORDER — METRONIDAZOLE 500 MG PO TABS: 500.0000 mg | ORAL_TABLET | Freq: Two times a day (BID) | ORAL | 0 refills | Status: AC
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
## 2019-08-05 ENCOUNTER — Emergency Department (HOSPITAL_COMMUNITY)
Admission: EM | Admit: 2019-08-05 | Discharge: 2019-08-05 | Disposition: A | Discharge status: Home / Self Care | Payer: 59 | Attending: Emergency Medicine | Admitting: Emergency Medicine

## 2019-08-05 DIAGNOSIS — Z5321 Procedure and treatment not carried out due to patient leaving prior to being seen by health care provider: Secondary | ICD-10-CM | POA: Insufficient documentation

## 2019-08-05 NOTE — ED Triage Notes (Signed)
Pt to ED c/o left leg pain over the past month. Pt ambulatory, full ROM noted to left leg, does c/o pain when she bends at knee. Denies known injury to the left leg.

## 2019-08-05 NOTE — ED Notes (Signed)
Called for pt x3, no response. PEDS RN called for pt with no response earlier.

## 2019-08-06 ENCOUNTER — Emergency Department (HOSPITAL_BASED_OUTPATIENT_CLINIC_OR_DEPARTMENT_OTHER): Payer: Self-pay

## 2019-08-06 ENCOUNTER — Encounter (HOSPITAL_BASED_OUTPATIENT_CLINIC_OR_DEPARTMENT_OTHER): Payer: Self-pay | Admitting: *Deleted

## 2019-08-06 ENCOUNTER — Other Ambulatory Visit: Payer: Self-pay

## 2019-08-06 ENCOUNTER — Emergency Department (HOSPITAL_BASED_OUTPATIENT_CLINIC_OR_DEPARTMENT_OTHER)
Admission: EM | Admit: 2019-08-06 | Discharge: 2019-08-06 | Disposition: A | Discharge status: Home / Self Care | Payer: Self-pay | Attending: Emergency Medicine | Admitting: Emergency Medicine

## 2019-08-06 DIAGNOSIS — M1712 Unilateral primary osteoarthritis, left knee: Secondary | ICD-10-CM | POA: Insufficient documentation

## 2019-08-06 DIAGNOSIS — Z79899 Other long term (current) drug therapy: Secondary | ICD-10-CM | POA: Insufficient documentation

## 2019-08-06 MED ORDER — DICLOFENAC SODIUM 1 % TD GEL: 2.0000 g | Freq: Four times a day (QID) | TRANSDERMAL | 0 refills | Status: AC

## 2019-08-06 MED ORDER — NAPROXEN 500 MG PO TABS: 500.0000 mg | ORAL_TABLET | Freq: Two times a day (BID) | ORAL | 0 refills | Status: DC

## 2019-08-06 NOTE — ED Notes (Signed)
Pt verbalized understanding of dc instructions.

## 2019-08-06 NOTE — ED Triage Notes (Signed)
Left knee pain for 2 months but got worst 2 days ago.  Knee pain started when she exercise.

## 2019-08-06 NOTE — ED Provider Notes (Signed)
[facility] Provider Note   CSN: [id number] Arrival date & time: 08/06/19  0900     History   Chief Complaint Chief Complaint  Patient presents with  . Knee Pain    HPI Julie Love is a 43 y.o. female with a past medical history of obesity presenting to the ED for left knee pain for the past 2 days.  Describes the pain as aching, worse with bending her knee in certain positions.  States that she had similar pain although that time was worse with straightening her leg 2 months ago.  This improved without intervention.  She has been taking glucosamine with no improvement in her symptoms.  She did notice the pain began and worsened after doing crunches and more exercise.  She denies any prior fracture, dislocation or procedure in the area.  She denies any direct injury, leg swelling, history of DVT, recent immobilization, numbness or weakness.     HPI  Past Medical History:  Diagnosis Date  . GERD (gastroesophageal reflux disease)     Patient Active Problem List   Diagnosis Date Noted  . Pain in thoracic spine 04/09/2017  . Class 1 obesity without serious comorbidity with body mass index (BMI) of 34.0 to 34.9 in adult 01/12/2017    Past Surgical History:  Procedure Laterality Date  . CESAREAN SECTION    . FOOT SURGERY Right   . TUBAL LIGATION       OB History    Gravida  6   Para      Term      Preterm      AB      Living  3     SAB      TAB      Ectopic      Multiple      Live Births               Home Medications    Prior to Admission medications   Medication Sig Start Date End Date Taking? Authorizing Provider  glucosamine-chondroitin 500-400 MG tablet Take 2 tablets by mouth daily.   Yes [provider]  diclofenac sodium (VOLTAREN) 1 % GEL Apply 2 g topically 4 (four) times daily. 08/06/19   Dinari Stgermaine, PA-C  naproxen (NAPROSYN) 500 MG tablet Take 1 tablet (500 mg total) by mouth 2 (two) times  daily. 08/06/19   Delia Heady, PA-C    Family History Family History  Problem Relation Age of Onset  . Hypertension Mother   . Cancer Neg Hx   . Heart disease Neg Hx   . Diabetes Neg Hx     Social History Social History   Tobacco Use  . Smoking status: Never Smoker  . Smokeless tobacco: Never Used  Substance Use Topics  . Alcohol use: No    Alcohol/week: 0.0 standard drinks  . Drug use: No     Allergies   Patient has no known allergies.   Review of Systems Review of Systems  Constitutional: Negative for chills and fever.  Musculoskeletal: Positive for arthralgias. Negative for myalgias.  Skin: Negative for wound.  Neurological: Negative for weakness and numbness.     Physical Exam Updated Vital Signs BP (!) 141/87 (BP Location: Left Arm)   Pulse 64   Temp 99.4 F (37.4 C) (Oral)   Resp 14   Ht 5\' 7"  (1.702 m)   Wt 92.5 kg   LMP 08/05/2019 (Exact Date)   SpO2 100%  BMI 31.95 kg/m   Physical Exam Vitals signs and nursing note reviewed.  Constitutional:      General: She is not in acute distress.    Appearance: She is well-developed. She is not diaphoretic.  HENT:     Head: Normocephalic and atraumatic.  Eyes:     General: No scleral icterus.    Conjunctiva/sclera: Conjunctivae normal.  Neck:     Musculoskeletal: Normal range of motion.  Pulmonary:     Effort: Pulmonary effort is normal. No respiratory distress.  Musculoskeletal:        General: No tenderness.     Comments: No tenderness to palpation of the left knee.  Pain with flexion of the left knee.  No calf tenderness, edema, erythema or warmth of joint noted.  Sensation intact to light touch.  2+ DP pulse palpated.  Skin:    Findings: No rash.  Neurological:     Mental Status: She is alert.      ED Treatments / Results  Labs (all labs ordered are listed, but only abnormal results are displayed) Labs Reviewed - No data to display  EKG None  Radiology Dg Knee Complete 4 Views  Left  Result Date: 08/06/2019 CLINICAL DATA:  Posterior left knee pain.  No injury. EXAM: LEFT KNEE - COMPLETE 4+ VIEW COMPARISON:  None. FINDINGS: Minimal osteoarthritic changes present. No evidence of acute fracture or dislocation. No significant joint effusion. IMPRESSION: No acute findings. Electronically Signed   By: Marin Olp M.D.   On: 08/06/2019 10:37    Procedures Procedures (including critical care time)  Medications Ordered in ED Medications - No data to display   Initial Impression / Assessment and Plan / ED Course  I have reviewed the triage vital signs and the nursing notes.  Pertinent labs & imaging results that were available during my care of the patient were reviewed by me and considered in my medical decision making (see chart for details).        43 year old female presents to the ED for with a chief complaint of left knee pain.  Pain has been going on for the past 2 days.  Worsened after she did exercise including crunches.  On exam external knee without abnormalities.  Able to perform range of motion but does note pain with flexion.  Had similar pain 2 months ago with extension.  Denies any direct injury or risk factors for DVT.  No edema noted. Xray shows no acute findings, does show mild arthritic changes. Doubt infectious or vascular cause of symptoms.  Will prescribe topical NSAIDs, advise oral NSAIDs as well as sports medicine followup.  Patient is hemodynamically stable, in NAD, and able to ambulate in the ED. Evaluation does not show pathology that would require ongoing emergent intervention or inpatient treatment. I explained the diagnosis to the patient. Pain has been managed and has no complaints prior to discharge. Patient is comfortable with above plan and is stable for discharge at this time. All questions were answered prior to disposition. Strict return precautions for returning to the ED were discussed. Encouraged follow up with PCP.   An After Visit  Summary was printed and given to the patient.   Portions of this note were generated with [employer]. Dictation errors may occur despite best attempts at proofreading.  Final Clinical Impressions(s) / ED Diagnoses   Final diagnoses:  Osteoarthritis of left knee, unspecified osteoarthritis type    ED Discharge Orders  Ordered    diclofenac sodium (VOLTAREN) 1 % GEL  4 times daily     08/06/19 1043    naproxen (NAPROSYN) 500 MG tablet  2 times daily     08/06/19 1044           Delia Heady, PA-C 08/06/19 1044    Virgel Manifold, MD 08/06/19 1115

## 2019-08-06 NOTE — Discharge Instructions (Signed)
Return to the ED for worsening symptoms, swelling of your leg or knee, injuries or falls, numbness or weakness.

## 2020-04-27 ENCOUNTER — Other Ambulatory Visit (HOSPITAL_COMMUNITY)
Admission: RE | Admit: 2020-04-27 | Discharge: 2020-04-27 | Disposition: A | Discharge status: Home / Self Care | Payer: POS | Source: Ambulatory Visit | Attending: Family Medicine | Admitting: Family Medicine

## 2020-04-27 ENCOUNTER — Ambulatory Visit (INDEPENDENT_AMBULATORY_CARE_PROVIDER_SITE_OTHER): Payer: POS | Admitting: Family Medicine

## 2020-04-27 ENCOUNTER — Encounter: Payer: Self-pay | Admitting: Family Medicine

## 2020-04-27 ENCOUNTER — Other Ambulatory Visit: Payer: Self-pay

## 2020-04-27 VITALS — BP 120/70 | HR 69 | Temp 98.2°F | Resp 12 | Ht 67.0 in | Wt 203.4 lb

## 2020-04-27 DIAGNOSIS — Z1231 Encounter for screening mammogram for malignant neoplasm of breast: Secondary | ICD-10-CM | POA: Diagnosis not present

## 2020-04-27 DIAGNOSIS — Z1159 Encounter for screening for other viral diseases: Secondary | ICD-10-CM

## 2020-04-27 DIAGNOSIS — Z1329 Encounter for screening for other suspected endocrine disorder: Secondary | ICD-10-CM

## 2020-04-27 DIAGNOSIS — Z130 Encounter for screening for diseases of the blood and blood-forming organs and certain disorders involving the immune mechanism: Secondary | ICD-10-CM

## 2020-04-27 DIAGNOSIS — Z124 Encounter for screening for malignant neoplasm of cervix: Secondary | ICD-10-CM

## 2020-04-27 DIAGNOSIS — Z13228 Encounter for screening for other metabolic disorders: Secondary | ICD-10-CM

## 2020-04-27 DIAGNOSIS — Z0000 Encounter for general adult medical examination without abnormal findings: Secondary | ICD-10-CM

## 2020-04-27 DIAGNOSIS — A5901 Trichomonal vulvovaginitis: Secondary | ICD-10-CM

## 2020-04-27 DIAGNOSIS — Z13220 Encounter for screening for lipoid disorders: Secondary | ICD-10-CM | POA: Diagnosis not present

## 2020-04-27 DIAGNOSIS — Z114 Encounter for screening for human immunodeficiency virus [HIV]: Secondary | ICD-10-CM

## 2020-04-27 NOTE — Patient Instructions (Signed)
Today you have you routine preventive visit. A few things to remember from today's visit:   Routine general medical examination at a health care facility  Visit for screening mammogram  Screening for lipoid disorders - Plan: Lipid panel  Screening for endocrine, metabolic and immunity disorder - Plan: Basic metabolic panel, Hemoglobin A1c  Cervical cancer screening - Plan: Cytology - PAP ()  Encounter for HCV screening test for low risk patient - Plan: Hepatitis C antibody, HIV Antibody (routine testing w rflx)  Encounter for screening for human immunodeficiency virus (HIV) - Plan: HIV Antibody (routine testing w rflx)  If you need refills please call your pharmacy. Do not use My Chart to request refills or for acute issues that need immediate attention.    Please be sure medication list is accurate. If a new problem present, please set up appointment sooner than planned today.  At least 150 minutes of moderate exercise per week, daily brisk walking for 15-30 min is a good exercise option. Healthy diet low in saturated (animal) fats and sweets and consisting of fresh fruits and vegetables, lean meats such as fish and white chicken and whole grains.  These are some of recommendations for screening depending of age and risk factors:  - Vaccines:  Tdap vaccine every 10 years.  Shingles vaccine recommended at age 6, could be given after 44 years of age but not sure about insurance coverage.   Pneumonia vaccines: Pneumovax at 69. Sometimes Pneumovax is giving earlier if history of smoking, lung disease,diabetes,kidney disease among some.  Screening for diabetes at age 98 and every 3 years.  Cervical cancer prevention:  Pap smear starts at 44 years of age and continues periodically until 44 years old in low risk women. Pap smear every 3 years between 79 and 44 years old. Pap smear every 3-5 years between women 73 and older if pap smear negative and HPV screening  negative.   -Breast cancer: Mammogram: There is disagreement between experts about when to start screening in low risk asymptomatic female but recent recommendations are to start screening at 38 and not later than 44 years old , every 1-2 years and after 44 yo q 2 years. Screening is recommended until 44 years old but some women can continue screening depending of healthy issues.  Colon cancer screening: Has been recently changed to 44 yo. Insurance may not cover until you are 44 years old. Screening is recommended until 44 years old.  Cholesterol disorder screening at age 70 and every 3 years.  Also recommended:  1. Dental visit- Brush and floss your teeth twice daily; visit your dentist twice a year. 2. Eye doctor- Get an eye exam at least every 2 years. 3. Helmet use- Always wear a helmet when riding a bicycle, motorcycle, rollerblading or skateboarding. 4. Safe sex- If you may be exposed to sexually transmitted infections, use a condom. 5. Seat belts- Seat belts can save your live; always wear one. 6. Smoke/Carbon Monoxide detectors- These detectors need to be installed on the appropriate level of your home. Replace batteries at least once a year. 7. Skin cancer- When out in the sun please cover up and use sunscreen 15 SPF or higher. 8. Violence- If anyone is threatening or hurting you, please tell your healthcare provider.  9. Drink alcohol in moderation- Limit alcohol intake to one drink or less per day. Never drink and drive. 10. Calcium supplementation 1000 to 1200 mg daily, ideally through your diet.  Vitamin D supplementation 800 units  daily.

## 2020-04-27 NOTE — Progress Notes (Signed)
HPI: Julie Love is a 44 y.o. female, who is here today for her routine physical.  Last CPE: 2016.  Regular exercise 3 or more time per week: She exercises 20 min of cardio. Following a healthy diet: Yes, vegetarian. She lives with her 34 and 37 yo daughters.  Chronic medical problems: Back pain.  Pap smear: 2016 negative for malignancy. HPV screening was not done. Hx of abnormal pap smears: Negative. Sexually active. No condom use.  Hx of STD's:Negative. Birth control: BTL. M:15 G:6 A:3 L:3  Immunization History  Administered Date(s) Administered  . Influenza-Unspecified 06/29/2018  . Tdap 09/12/2014   Mammogram: Never. Colonoscopy: N/A DEXA: N/A  Hep C screening : Never.  She has no concerns today.  Review of Systems  Constitutional: Negative for appetite change, fatigue and fever.  HENT: Negative for hearing loss, mouth sores, sore throat and trouble swallowing.   Eyes: Negative for redness and visual disturbance.  Respiratory: Negative for cough, shortness of breath and wheezing.   Cardiovascular: Negative for chest pain, palpitations and leg swelling.  Gastrointestinal: Negative for abdominal pain, nausea and vomiting.       No changes in bowel habits.  Endocrine: Negative for cold intolerance, heat intolerance, polydipsia, polyphagia and polyuria.  Genitourinary: Negative for decreased urine volume, dysuria, hematuria, vaginal bleeding and vaginal discharge.  Musculoskeletal: Negative for gait problem and myalgias.  Skin: Negative for color change and rash.  Allergic/Immunologic: Negative for environmental allergies.  Neurological: Negative for syncope, weakness and headaches.  Psychiatric/Behavioral: Negative for confusion. The patient is not nervous/anxious.   All other systems reviewed and are negative.  Current Outpatient Medications on File Prior to Visit  Medication Sig Dispense Refill  . diclofenac sodium (VOLTAREN) 1 % GEL Apply 2 g  topically 4 (four) times daily. 100 g 0  . glucosamine-chondroitin 500-400 MG tablet Take 2 tablets by mouth daily.     No current facility-administered medications on file prior to visit.   Past Medical History:  Diagnosis Date  . GERD (gastroesophageal reflux disease)     Past Surgical History:  Procedure Laterality Date  . CESAREAN SECTION    . FOOT SURGERY Right   . TUBAL LIGATION     No Known Allergies  Family History  Problem Relation Age of Onset  . Hypertension Mother   . Cancer Neg Hx   . Heart disease Neg Hx   . Diabetes Neg Hx     Social History   Socioeconomic History  . Marital status: Married    Spouse name: Not on file  . Number of children: Not on file  . Years of education: Not on file  . Highest education level: Not on file  Occupational History  . Not on file  Tobacco Use  . Smoking status: Never Smoker  . Smokeless tobacco: Never Used  Substance and Sexual Activity  . Alcohol use: No    Alcohol/week: 0.0 standard drinks  . Drug use: No  . Sexual activity: Yes  Other Topics Concern  . Not on file  Social History Narrative  . Not on file   Social Determinants of Health   Financial Resource Strain:   . Difficulty of Paying Living Expenses:   Food Insecurity:   . Worried About Charity fundraiser in the Last Year:   . Arboriculturist in the Last Year:   Transportation Needs:   . Film/video editor (Medical):   Marland Kitchen Lack of Transportation (Non-Medical):  Physical Activity:   . Days of Exercise per Week:   . Minutes of Exercise per Session:   Stress:   . Feeling of Stress :   Social Connections:   . Frequency of Communication with Friends and Family:   . Frequency of Social Gatherings with Friends and Family:   . Attends Religious Services:   . Active Member of Clubs or Organizations:   . Attends Archivist Meetings:   Marland Kitchen Marital Status:    Vitals:   04/27/20 1129  BP: 120/70  Pulse: 69  Resp: 12  Temp: 98.2 F  (36.8 C)  SpO2: 99%   Body mass index is 31.85 kg/m.  Wt Readings from Last 3 Encounters:  04/27/20 (!) 203 lb 6 oz (92.3 kg)  08/06/19 204 lb (92.5 kg)  08/05/19 204 lb (92.5 kg)   Physical Exam Vitals and nursing note reviewed.  Constitutional:      General: She is not in acute distress.    Appearance: She is well-developed.  HENT:     Head: Normocephalic and atraumatic.     Right Ear: Hearing, tympanic membrane, ear canal and external ear normal.     Left Ear: Hearing, tympanic membrane, ear canal and external ear normal.     Mouth/Throat:     Mouth: Mucous membranes are moist.     Pharynx: Oropharynx is clear. Uvula midline.  Eyes:     Extraocular Movements: Extraocular movements intact.     Conjunctiva/sclera: Conjunctivae normal.     Pupils: Pupils are equal, round, and reactive to light.  Neck:     Thyroid: No thyromegaly.     Trachea: No tracheal deviation.  Cardiovascular:     Rate and Rhythm: Normal rate and regular rhythm.     Pulses:          Dorsalis pedis pulses are 2+ on the right side and 2+ on the left side.     Heart sounds: No murmur heard.   Pulmonary:     Effort: Pulmonary effort is normal. No respiratory distress.     Breath sounds: Normal breath sounds.  Abdominal:     Palpations: Abdomen is soft. There is no hepatomegaly or mass.     Tenderness: There is no abdominal tenderness.  Genitourinary:    Labia:        Right: No rash, tenderness or lesion.        Left: No rash, tenderness or lesion.      Vagina: Vaginal discharge present. No erythema or bleeding.     Cervix: No cervical motion tenderness, discharge or friability.     Uterus: Not enlarged and not tender.      Adnexa:        Right: No mass, tenderness or fullness.         Left: No mass, tenderness or fullness.       Comments: Breast: No masses, skin abnormalities, or nipple discharge appreciated bilateral. Pap smear collected. Musculoskeletal:     Comments: No major deformity or  signs of synovitis appreciated.  Lymphadenopathy:     Cervical: No cervical adenopathy.     Upper Body:     Right upper body: No supraclavicular or axillary adenopathy.     Left upper body: No supraclavicular or axillary adenopathy.  Skin:    General: Skin is warm.     Findings: No erythema or rash.  Neurological:     General: No focal deficit present.     Mental Status: She is  alert and oriented to person, place, and time.     Cranial Nerves: No cranial nerve deficit.     Coordination: Coordination normal.     Gait: Gait normal.     Deep Tendon Reflexes:     Reflex Scores:      Bicep reflexes are 2+ on the right side and 2+ on the left side.      Patellar reflexes are 2+ on the right side and 2+ on the left side. Psychiatric:        Mood and Affect: Mood and affect normal.     Comments: Well groomed, good eye contact.    ASSESSMENT AND PLAN:  Julie Love was here today annual physical examination.  Orders Placed This Encounter  Procedures  . MM DIAG BREAST TOMO BILATERAL  . Basic metabolic panel  . Hemoglobin A1c  . Hepatitis C antibody  . Lipid panel  . HIV Antibody (routine testing w rflx)   Lab Results  Component Value Date   CHOL 117 04/27/2020   HDL 40 (L) 04/27/2020   LDLCALC 67 04/27/2020   TRIG 36 04/27/2020   CHOLHDL 2.9 04/27/2020   Lab Results  Component Value Date   HGBA1C 5.0 04/27/2020   Lab Results  Component Value Date   CREATININE 0.80 04/27/2020   BUN 11 04/27/2020   NA 137 04/27/2020   K 4.0 04/27/2020   CL 105 04/27/2020   CO2 23 04/27/2020   Routine general medical examination at a health care facility We discussed the importance of regular physical activity and healthy diet for prevention of chronic illness and/or complications. Preventive guidelines reviewed. Vaccination up to date.  Next CPE in a year.  Visit for screening mammogram -     MM DIAG BREAST TOMO BILATERAL; Future  Screening for lipoid disorders -      Lipid panel  Screening for endocrine, metabolic and immunity disorder -     Hemoglobin A1c -     Basic metabolic panel  Cervical cancer screening -     Cytology - PAP ([place])  Encounter for HCV screening test for low risk patient -     Hepatitis C antibody  Encounter for screening for human immunodeficiency virus (HIV) -     HIV Antibody (routine testing w rflx)   Return in 1 year (on 04/27/2021) for CMP.   Meilyn Heindl G. Martinique, MD  [facility] [place] office.    Today you have you routine preventive visit. A few things to remember from today's visit:   Routine general medical examination at a health care facility  Visit for screening mammogram  Screening for lipoid disorders - Plan: Lipid panel  Screening for endocrine, metabolic and immunity disorder - Plan: Basic metabolic panel, Hemoglobin A1c  Cervical cancer screening - Plan: Cytology - PAP ()  Encounter for HCV screening test for low risk patient - Plan: Hepatitis C antibody, HIV Antibody (routine testing w rflx)  Encounter for screening for human immunodeficiency virus (HIV) - Plan: HIV Antibody (routine testing w rflx)  If you need refills please call your pharmacy. Do not use My Chart to request refills or for acute issues that need immediate attention.    Please be sure medication list is accurate. If a new problem present, please set up appointment sooner than planned today.  At least 150 minutes of moderate exercise per week, daily brisk walking for 15-30 min is a good exercise option. Healthy diet low in saturated (animal) fats and  sweets and consisting of fresh fruits and vegetables, lean meats such as fish and white chicken and whole grains.  These are some of recommendations for screening depending of age and risk factors:  - Vaccines:  Tdap vaccine every 10 years.  Shingles vaccine recommended at age 72, could be given after 44 years of age but not sure about insurance  coverage.   Pneumonia vaccines: Pneumovax at 36. Sometimes Pneumovax is giving earlier if history of smoking, lung disease,diabetes,kidney disease among some.  Screening for diabetes at age 26 and every 3 years.  Cervical cancer prevention:  Pap smear starts at 44 years of age and continues periodically until 44 years old in low risk women. Pap smear every 3 years between 56 and 23 years old. Pap smear every 3-5 years between women 20 and older if pap smear negative and HPV screening negative.   -Breast cancer: Mammogram: There is disagreement between experts about when to start screening in low risk asymptomatic female but recent recommendations are to start screening at 76 and not later than 44 years old , every 1-2 years and after 44 yo q 2 years. Screening is recommended until 44 years old but some women can continue screening depending of healthy issues.  Colon cancer screening: Has been recently changed to 44 yo. Insurance may not cover until you are 44 years old. Screening is recommended until 44 years old.  Cholesterol disorder screening at age 55 and every 3 years.  Also recommended:  1. Dental visit- Brush and floss your teeth twice daily; visit your dentist twice a year. 2. Eye doctor- Get an eye exam at least every 2 years. 3. Helmet use- Always wear a helmet when riding a bicycle, motorcycle, rollerblading or skateboarding. 4. Safe sex- If you may be exposed to sexually transmitted infections, use a condom. 5. Seat belts- Seat belts can save your live; always wear one. 6. Smoke/Carbon Monoxide detectors- These detectors need to be installed on the appropriate level of your home. Replace batteries at least once a year. 7. Skin cancer- When out in the sun please cover up and use sunscreen 15 SPF or higher. 8. Violence- If anyone is threatening or hurting you, please tell your healthcare provider.  9. Drink alcohol in moderation- Limit alcohol intake to one drink or less per  day. Never drink and drive. 10. Calcium supplementation 1000 to 1200 mg daily, ideally through your diet.  Vitamin D supplementation 800 units daily.

## 2020-04-30 LAB — CYTOLOGY - PAP
Chlamydia: NEGATIVE
Comment: NEGATIVE
Comment: NEGATIVE
Comment: NEGATIVE
Comment: NORMAL
Diagnosis: NEGATIVE
High risk HPV: NEGATIVE
Neisseria Gonorrhea: NEGATIVE
Trichomonas: POSITIVE — AB

## 2020-04-30 LAB — HEMOGLOBIN A1C
Hgb A1c MFr Bld: 5 % of total Hgb (ref <5.7)
Mean Plasma Glucose: 97 (calc)
eAG (mmol/L): 5.4 (calc)

## 2020-04-30 LAB — BASIC METABOLIC PANEL
BUN: 11 mg/dL (ref 7–25)
CO2: 23 mmol/L (ref 20–32)
Calcium: 8.6 mg/dL (ref 8.6–10.2)
Chloride: 105 mmol/L (ref 98–110)
Creat: 0.8 mg/dL (ref 0.50–1.10)
Glucose, Bld: 71 mg/dL (ref 65–99)
Potassium: 4 mmol/L (ref 3.5–5.3)
Sodium: 137 mmol/L (ref 135–146)

## 2020-04-30 LAB — HIV ANTIBODY (ROUTINE TESTING W REFLEX): HIV 1&2 Ab, 4th Generation: NONREACTIVE

## 2020-04-30 LAB — HEPATITIS C ANTIBODY
Hepatitis C Ab: NONREACTIVE
SIGNAL TO CUT-OFF: 0.06 (ref <1.00)

## 2020-04-30 LAB — LIPID PANEL
Cholesterol: 117 mg/dL (ref <200)
HDL: 40 mg/dL — ABNORMAL LOW (ref >=50)
LDL Cholesterol (Calc): 67 mg/dL (calc)
Non-HDL Cholesterol (Calc): 77 mg/dL (calc) (ref <130)
Total CHOL/HDL Ratio: 2.9 (calc) (ref <5.0)
Triglycerides: 36 mg/dL (ref <150)

## 2020-05-02 MED ORDER — METRONIDAZOLE 500 MG PO TABS: 2000.0000 mg | ORAL_TABLET | Freq: Once | ORAL | 0 refills | Status: AC

## 2020-05-02 NOTE — Addendum Note (Signed)
Addended by: Martinique, Abdiaziz Klahn G on: 05/02/2020 05:59 PM   Modules accepted: Orders

## 2020-05-28 ENCOUNTER — Telehealth: Payer: Self-pay | Admitting: Family Medicine

## 2020-05-28 NOTE — Telephone Encounter (Signed)
Pt call and stated she tested positive for covid and want to know is there any vit she can take to help and want a call back.

## 2020-05-29 ENCOUNTER — Encounter: Payer: Self-pay | Admitting: Family Medicine

## 2020-05-29 ENCOUNTER — Telehealth (INDEPENDENT_AMBULATORY_CARE_PROVIDER_SITE_OTHER): Payer: POS | Admitting: Family Medicine

## 2020-05-29 VITALS — Temp 99.7°F | Ht 67.0 in

## 2020-05-29 DIAGNOSIS — U071 COVID-19: Secondary | ICD-10-CM

## 2020-05-29 NOTE — Telephone Encounter (Signed)
Appt made

## 2020-05-29 NOTE — Telephone Encounter (Signed)
Pt can do the virtual at 4:30 she would like the link sent to her at [phone]

## 2020-05-29 NOTE — Telephone Encounter (Signed)
Pt is calling in to check the status of the msg that was sent.  Pt would like to see if she can have a call back to let her know what she should do b/c she is really feeling bad.  Pt is aware that we ask for a 24-48 hr response to any msgs sent back to the provider.

## 2020-05-29 NOTE — Progress Notes (Signed)
Virtual Visit via Video Note I connected with Julie Love on 05/29/20 by a video enabled telemedicine application and verified that I am speaking with the correct person using two identifiers.  Location patient: home Location provider:work office Persons participating in the virtual visit: patient, provider  I discussed the limitations of evaluation and management by telemedicine and the availability of in person appointments. The patient expressed understanding and agreed to proceed.  HPI: Julie. Julie Love is a 44 yo female with hx of HTN who was recently dx'ed with COVID 19 infection. She has been sick for about 7 days. A couple days before her dx, her nephew was positive for COVID 19. She started with cold chills and non productive cough. + Nasal congestion,rhinorrhea,and post nasal drainage. No anosmia or ageusia. Increased appetite. Cough has greatly improved. Max temp 99 F. Right-sided frontal headache,mild,Tylenol helps. No associated visual changes, N/V,or focal deficit.  Negative for sore throat,CP,SOB,wheezing,abdominal pain,changes in bowel habits,or skin rash.  ROS: See pertinent positives and negatives per HPI.  Past Medical History:  Diagnosis Date  . GERD (gastroesophageal reflux disease)     Past Surgical History:  Procedure Laterality Date  . CESAREAN SECTION    . FOOT SURGERY Right   . TUBAL LIGATION      Family History  Problem Relation Age of Onset  . Hypertension Mother   . Cancer Neg Hx   . Heart disease Neg Hx   . Diabetes Neg Hx     Social History   Socioeconomic History  . Marital status: Married    Spouse name: Not on file  . Number of children: Not on file  . Years of education: Not on file  . Highest education level: Not on file  Occupational History  . Not on file  Tobacco Use  . Smoking status: Never Smoker  . Smokeless tobacco: Never Used  Substance and Sexual Activity  . Alcohol use: No    Alcohol/week: 0.0 standard drinks  . Drug use:  No  . Sexual activity: Yes  Other Topics Concern  . Not on file  Social History Narrative  . Not on file   Social Determinants of Health   Financial Resource Strain:   . Difficulty of Paying Living Expenses: Not on file  Food Insecurity:   . Worried About Charity fundraiser in the Last Year: Not on file  . Ran Out of Food in the Last Year: Not on file  Transportation Needs:   . Lack of Transportation (Medical): Not on file  . Lack of Transportation (Non-Medical): Not on file  Physical Activity:   . Days of Exercise per Week: Not on file  . Minutes of Exercise per Session: Not on file  Stress:   . Feeling of Stress : Not on file  Social Connections:   . Frequency of Communication with Friends and Family: Not on file  . Frequency of Social Gatherings with Friends and Family: Not on file  . Attends Religious Services: Not on file  . Active Member of Clubs or Organizations: Not on file  . Attends Archivist Meetings: Not on file  . Marital Status: Not on file  Intimate Partner Violence:   . Fear of Current or Ex-Partner: Not on file  . Emotionally Abused: Not on file  . Physically Abused: Not on file  . Sexually Abused: Not on file    Current Outpatient Medications:  .  diclofenac sodium (VOLTAREN) 1 % GEL, Apply 2 g topically 4 (four)  times daily., Disp: 100 g, Rfl: 0 .  glucosamine-chondroitin 500-400 MG tablet, Take 2 tablets by mouth daily., Disp: , Rfl:   EXAM:  VITALS per patient if applicable:Temp 95.1 F (37.6 C)   Ht 5\' 7"  (1.702 m)   BMI 31.85 kg/m   GENERAL: alert, oriented, appears well and in no acute distress  HEENT: atraumatic, conjunctiva clear, no obvious abnormalities on inspection of external nose and ears  NECK: normal movements of the head and neck  LUNGS: on inspection no signs of respiratory distress, breathing rate appears normal, no obvious gross SOB, gasping or wheezing  CV: no obvious cyanosis  PSYCH/NEURO: pleasant and  cooperative, no obvious depression or anxiety, speech and thought processing grossly intact  ASSESSMENT AND PLAN:  Discussed the following assessment and plan:  COVID-19 virus infection  We discussed treatment options, I do not think she needs monoclonal ab infusion. Symptoms already improving. Continue monitoring for fever or new symptoms. Tylenol 500 mg 3-4 tabs per day if needed. Instructed about warning signs. Work note to go back 06/05/20.   I discussed the assessment and treatment plan with the patient. Julie Love was provided an opportunity to ask questions and all were answered. She agreed with the plan and demonstrated an understanding of the instructions.  Return if symptoms worsen or fail to improve.   Niharika Savino Martinique, MD

## 2020-05-29 NOTE — Telephone Encounter (Signed)
Can you see if pt can do a virtual visit at 4:30 today? I will add her on.

## 2020-05-29 NOTE — Telephone Encounter (Signed)
LVM to schedule virtual with Martinique

## 2020-06-05 ENCOUNTER — Telehealth: Payer: Self-pay | Admitting: Family Medicine

## 2020-06-05 ENCOUNTER — Encounter: Payer: Self-pay | Admitting: Family Medicine

## 2020-06-05 NOTE — Telephone Encounter (Signed)
I spoke with pt. She is aware that the new work note is up front for pick up.

## 2020-06-05 NOTE — Telephone Encounter (Signed)
Pt is calling to say she needs her return to work note to date back August 29th when she tested positive for COVID.  She will be able to pick it today or tomorrow before she returns to work at 8 pm that night.  Please advise

## 2020-06-07 ENCOUNTER — Encounter: Payer: Self-pay | Admitting: *Deleted

## 2020-06-08 ENCOUNTER — Ambulatory Visit: Payer: POS

## 2020-08-06 ENCOUNTER — Other Ambulatory Visit (HOSPITAL_COMMUNITY)
Admission: RE | Admit: 2020-08-06 | Discharge: 2020-08-06 | Disposition: A | Discharge status: Home / Self Care | Payer: POS | Source: Ambulatory Visit | Attending: Family Medicine | Admitting: Family Medicine

## 2020-08-06 ENCOUNTER — Ambulatory Visit: Payer: POS | Admitting: Family Medicine

## 2020-08-06 ENCOUNTER — Other Ambulatory Visit: Payer: Self-pay

## 2020-08-06 ENCOUNTER — Encounter: Payer: Self-pay | Admitting: Family Medicine

## 2020-08-06 VITALS — BP 120/80 | HR 84 | Temp 98.7°F | Resp 16 | Ht 67.0 in | Wt 197.0 lb

## 2020-08-06 DIAGNOSIS — N938 Other specified abnormal uterine and vaginal bleeding: Secondary | ICD-10-CM | POA: Diagnosis present

## 2020-08-06 DIAGNOSIS — Z8619 Personal history of other infectious and parasitic diseases: Secondary | ICD-10-CM | POA: Insufficient documentation

## 2020-08-06 DIAGNOSIS — D509 Iron deficiency anemia, unspecified: Secondary | ICD-10-CM

## 2020-08-06 DIAGNOSIS — R7989 Other specified abnormal findings of blood chemistry: Secondary | ICD-10-CM

## 2020-08-06 LAB — POCT URINE PREGNANCY: Preg Test, Ur: NEGATIVE

## 2020-08-06 NOTE — Patient Instructions (Signed)
A few things to remember from today's visit:   DUB (dysfunctional uterine bleeding) - Plan: US PELVIC COMPLETE WITH TRANSVAGINAL, TSH, CBC, POCT urine pregnancy, Urine cytology ancillary only  If you need refills please call your pharmacy. Do not use My Chart to request refills or for acute issues that need immediate attention.    Abnormal Uterine Bleeding Abnormal uterine bleeding means bleeding more than usual from your uterus. It can include:  Bleeding between periods.  Bleeding after sex.  Bleeding that is heavier than normal.  Periods that last longer than usual.  Bleeding after you have stopped having your period (menopause). There are many problems that may cause this. You should see a doctor for any kind of bleeding that is not normal. Treatment depends on the cause of the bleeding. Follow these instructions at home:  Watch your condition for any changes.  Do not use tampons, douche, or have sex, if your doctor tells you not to.  Change your pads often.  Get regular well-woman exams. Make sure they include a pelvic exam and cervical cancer screening.  Keep all follow-up visits as told by your doctor. This is important. Contact a doctor if:  The bleeding lasts more than one week.  You feel dizzy at times.  You feel like you are going to throw up (nauseous).  You throw up. Get help right away if:  You pass out.  You have to change pads every hour.  You have belly (abdominal) pain.  You have a fever.  You get sweaty.  You get weak.  You passing large blood clots from your vagina. Summary  Abnormal uterine bleeding means bleeding more than usual from your uterus.  There are many problems that may cause this. You should see a doctor for any kind of bleeding that is not normal.  Treatment depends on the cause of the bleeding. This information is not intended to replace advice given to you by your health care provider. Make sure you discuss any  questions you have with your health care provider. Document Revised: 09/09/2016 Document Reviewed: 09/09/2016 Elsevier Patient Education  [employer].  Please be sure medication list is accurate. If a new problem present, please set up appointment sooner than planned today.

## 2020-08-06 NOTE — Progress Notes (Signed)
Chief Complaint  Patient presents with  . Vaginal Bleeding   HPI: [name] is a 44 y.o. female, who is here today with above complaint.  Intermittent vaginal bleeding last week. Not sure about exacerbating or alleviating factors.  Spotting started 07/21/20 after intercourse. Last episode this past Friday.  LMP 07/15/20-07/19/20.  Sexual intercourse with no condom on 07/21/20. Hx of trichomonas infection, 03/2020. She completed metronidazole treatment. Sexual partner was also treated. She has one sex partner.  Negative for fever,chills,unusual fatigue,oral lesions,abdominal pain,N/V,urinary symptoms,skin rash.  Pap smear on 04/27/20 Negative for malignancy and negative HPV.  Review of Systems  Constitutional: Positive for fatigue. Negative for activity change and appetite change.  HENT: Negative for sore throat.   Respiratory: Negative for shortness of breath.   Cardiovascular: Negative for chest pain, palpitations and leg swelling.  Genitourinary: Negative for decreased urine volume, dyspareunia, dysuria and hematuria.  Musculoskeletal: Negative for gait problem and joint swelling.  Skin: Negative for rash and wound.  Hematological: Negative for adenopathy. Does not bruise/bleed easily.  Rest see pertinent positives and negatives per HPI.  Current Outpatient Medications on File Prior to Visit  Medication Sig Dispense Refill  . diclofenac sodium (VOLTAREN) 1 % GEL Apply 2 g topically 4 (four) times daily. 100 g 0  . glucosamine-chondroitin 500-400 MG tablet Take 2 tablets by mouth daily.     No current facility-administered medications on file prior to visit.   Past Medical History:  Diagnosis Date  . GERD (gastroesophageal reflux disease)    No Known Allergies  Social History   Socioeconomic History  . Marital status: Married    Spouse name: Not on file  . Number of children: Not on file  . Years of education: Not on file  . Highest education level:  Not on file  Occupational History  . Not on file  Tobacco Use  . Smoking status: Never Smoker  . Smokeless tobacco: Never Used  Substance and Sexual Activity  . Alcohol use: No    Alcohol/week: 0.0 standard drinks  . Drug use: No  . Sexual activity: Yes  Other Topics Concern  . Not on file  Social History Narrative  . Not on file   Social Determinants of Health   Financial Resource Strain:   . Difficulty of Paying Living Expenses: Not on file  Food Insecurity:   . Worried About Charity fundraiser in the Last Year: Not on file  . Ran Out of Food in the Last Year: Not on file  Transportation Needs:   . Lack of Transportation (Medical): Not on file  . Lack of Transportation (Non-Medical): Not on file  Physical Activity:   . Days of Exercise per Week: Not on file  . Minutes of Exercise per Session: Not on file  Stress:   . Feeling of Stress : Not on file  Social Connections:   . Frequency of Communication with Friends and Family: Not on file  . Frequency of Social Gatherings with Friends and Family: Not on file  . Attends Religious Services: Not on file  . Active Member of Clubs or Organizations: Not on file  . Attends Archivist Meetings: Not on file  . Marital Status: Not on file    Vitals:   08/06/20 0915  BP: 120/80  Pulse: 84  Resp: 16  Temp: 98.7 F (37.1 C)  SpO2: 100%   Body mass index is 30.85 kg/m.  Physical Exam Vitals and nursing note reviewed.  Constitutional:      General: She is not in acute distress.    Appearance: She is well-developed.  HENT:     Head: Normocephalic and atraumatic.  Eyes:     Conjunctiva/sclera: Conjunctivae normal.  Cardiovascular:     Rate and Rhythm: Normal rate and regular rhythm.  Pulmonary:     Effort: Pulmonary effort is normal. No respiratory distress.     Breath sounds: Normal breath sounds.  Abdominal:     Palpations: Abdomen is soft. There is no mass.     Tenderness: There is no abdominal  tenderness.  Genitourinary:    Comments: Deferred for next visit if needed. Lymphadenopathy:     Cervical: No cervical adenopathy.  Skin:    General: Skin is warm.     Findings: No erythema or rash.  Neurological:     Mental Status: She is alert and oriented to person, place, and time.  Psychiatric:        Mood and Affect: Mood is anxious.        Speech: Speech normal.   ASSESSMENT AND PLAN:  [name] was seen today for vaginal bleeding.  Diagnoses and all orders for this visit: Orders Placed This Encounter  Procedures  . US PELVIC COMPLETE WITH TRANSVAGINAL  . TSH  . CBC  . POCT urine pregnancy   Lab Results  Component Value Date   WBC 4.1 08/06/2020   HGB 10.9 (L) 08/06/2020   HCT 33.3 (L) 08/06/2020   MCV 89.3 08/06/2020   PLT 188 08/06/2020   Lab Results  Component Value Date   TSH 4.87 (H) 08/06/2020   DUB (dysfunctional uterine bleeding) We discussed possible etiologies. She is not symptomatic today. Instructed about warning signs. Further recommendations according to labs and [place] results. If work-up is negative and problem is recurrent hormonal therapy can be considered.  Hx of trichomonal vaginitis Re-infection to be considered as part of etiology of abnormal vaginal bleeding. Encouraged safe sex practices,condom use.  Return if symptoms worsen or fail to improve.   Cassie Shedlock G. Martin[place], MD  [facility]. [place] office.  A few things to remember from today's visit:   DUB (dysfunctional uterine bleeding) - Plan: US PELVIC COMPLETE WITH TRANSVAGINAL, TSH, CBC, POCT urine pregnancy, Urine cytology ancillary only  If you need refills please call your pharmacy. Do not use My Chart to request refills or for acute issues that need immediate attention.    Abnormal Uterine Bleeding Abnormal uterine bleeding means bleeding more than usual from your uterus. It can include:  Bleeding between periods.  Bleeding after sex.  Bleeding that is  heavier than normal.  Periods that last longer than usual.  Bleeding after you have stopped having your period (menopause). There are many problems that may cause this. You should see a doctor for any kind of bleeding that is not normal. Treatment depends on the cause of the bleeding. Follow these instructions at home:  Watch your condition for any changes.  Do not use tampons, douche, or have sex, if your doctor tells you not to.  Change your pads often.  Get regular well-woman exams. Make sure they include a pelvic exam and cervical cancer screening.  Keep all follow-up visits as told by your doctor. This is important. Contact a doctor if:  The bleeding lasts more than one week.  You feel dizzy at times.  You feel like you are going to throw up (nauseous).  You throw up. Get help right away if:  You pass out.  You have to change pads every hour.  You have belly (abdominal) pain.  You have a fever.  You get sweaty.  You get weak.  You passing large blood clots from your vagina. Summary  Abnormal uterine bleeding means bleeding more than usual from your uterus.  There are many problems that may cause this. You should see a doctor for any kind of bleeding that is not normal.  Treatment depends on the cause of the bleeding. This information is not intended to replace advice given to you by your health care provider. Make sure you discuss any questions you have with your health care provider. Document Revised: 09/09/2016 Document Reviewed: 09/09/2016 Elsevier Patient Education  [place].  Please be sure medication list is accurate. If a new problem present, please set up appointment sooner than planned today.

## 2020-08-07 LAB — URINE CYTOLOGY ANCILLARY ONLY
Chlamydia: NEGATIVE
Comment: NEGATIVE
Comment: NEGATIVE
Comment: NORMAL
Neisseria Gonorrhea: NEGATIVE
Trichomonas: NEGATIVE

## 2020-08-07 LAB — CBC
HCT: 33.3 % — ABNORMAL LOW (ref 35.0–45.0)
Hemoglobin: 10.9 g/dL — ABNORMAL LOW (ref 11.7–15.5)
MCH: 29.2 pg (ref 27.0–33.0)
MCHC: 32.7 g/dL (ref 32.0–36.0)
MCV: 89.3 fL (ref 80.0–100.0)
MPV: 10.2 fL (ref 7.5–12.5)
Platelets: 188 10*3/uL (ref 140–400)
RBC: 3.73 10*6/uL — ABNORMAL LOW (ref 3.80–5.10)
RDW: 12.5 % (ref 11.0–15.0)
WBC: 4.1 10*3/uL (ref 3.8–10.8)

## 2020-08-07 LAB — TSH: TSH: 4.87 mIU/L — ABNORMAL HIGH

## 2020-08-17 ENCOUNTER — Other Ambulatory Visit: Payer: Self-pay

## 2020-08-17 ENCOUNTER — Other Ambulatory Visit: Payer: POS

## 2020-08-17 DIAGNOSIS — D509 Iron deficiency anemia, unspecified: Secondary | ICD-10-CM

## 2020-08-17 DIAGNOSIS — R7989 Other specified abnormal findings of blood chemistry: Secondary | ICD-10-CM

## 2020-08-18 LAB — T4, FREE: Free T4: 1.3 ng/dL (ref 0.8–1.8)

## 2020-08-18 LAB — FERRITIN: Ferritin: 40 ng/mL (ref 16–232)

## 2020-08-18 LAB — TSH: TSH: 3.4 mIU/L

## 2020-08-18 LAB — T3, FREE: T3, Free: 3.1 pg/mL (ref 2.3–4.2)

## 2020-11-30 IMAGING — CR DG KNEE COMPLETE 4+V*L*
4 series · 4 of 4 positions shown · non-contrast
Comparison: None.

CLINICAL DATA: Posterior left knee pain.  No injury.

EXAM:
LEFT KNEE - COMPLETE 4+ VIEW

[t knee ap left]
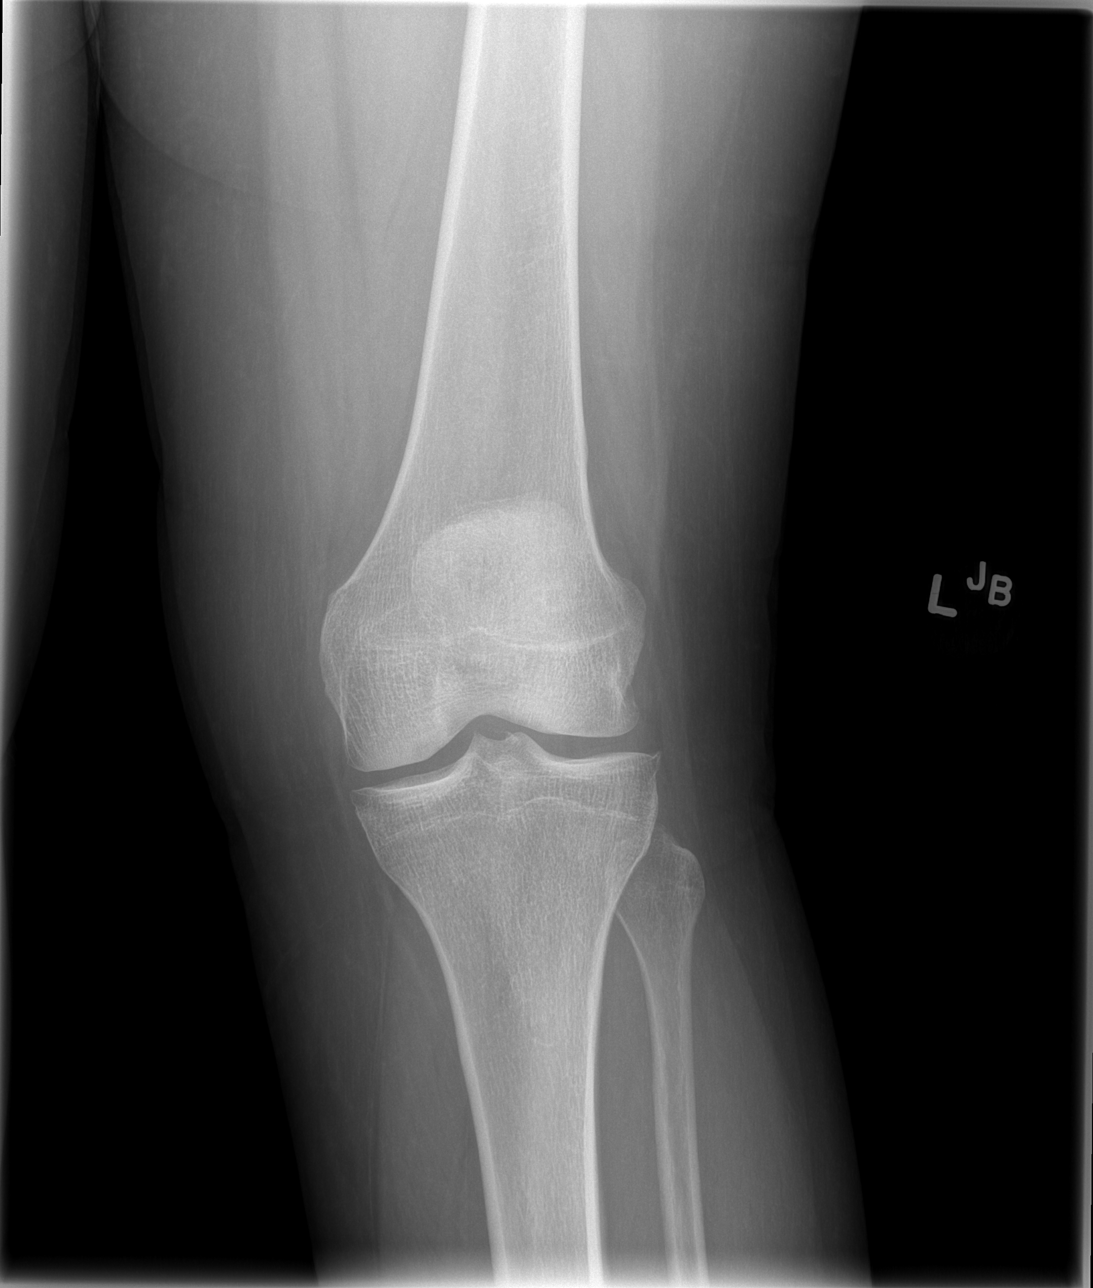

[t knee oblique left (1 of 2)]
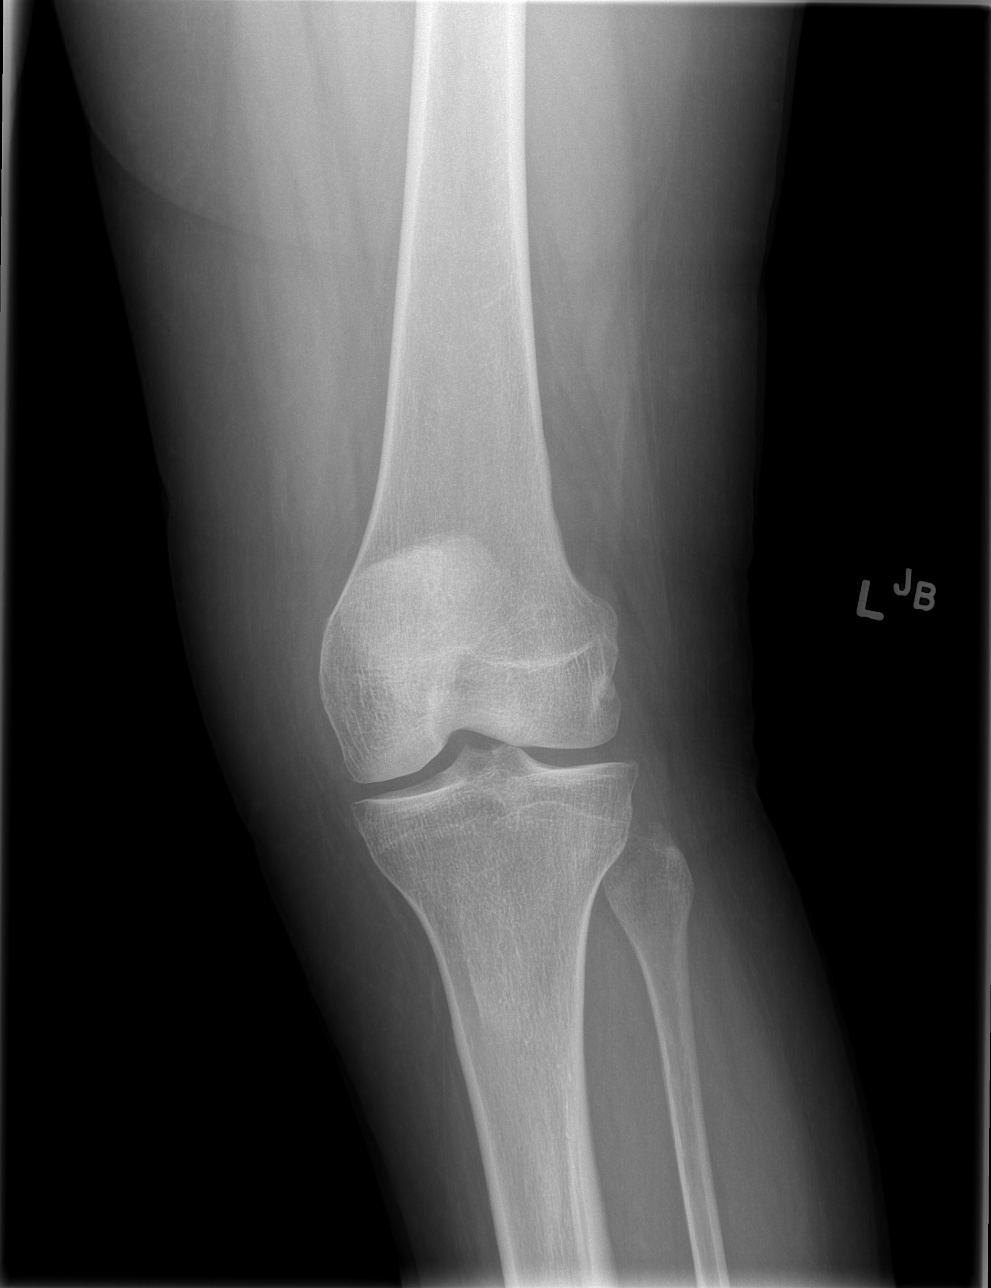

[t knee oblique left (2 of 2)]
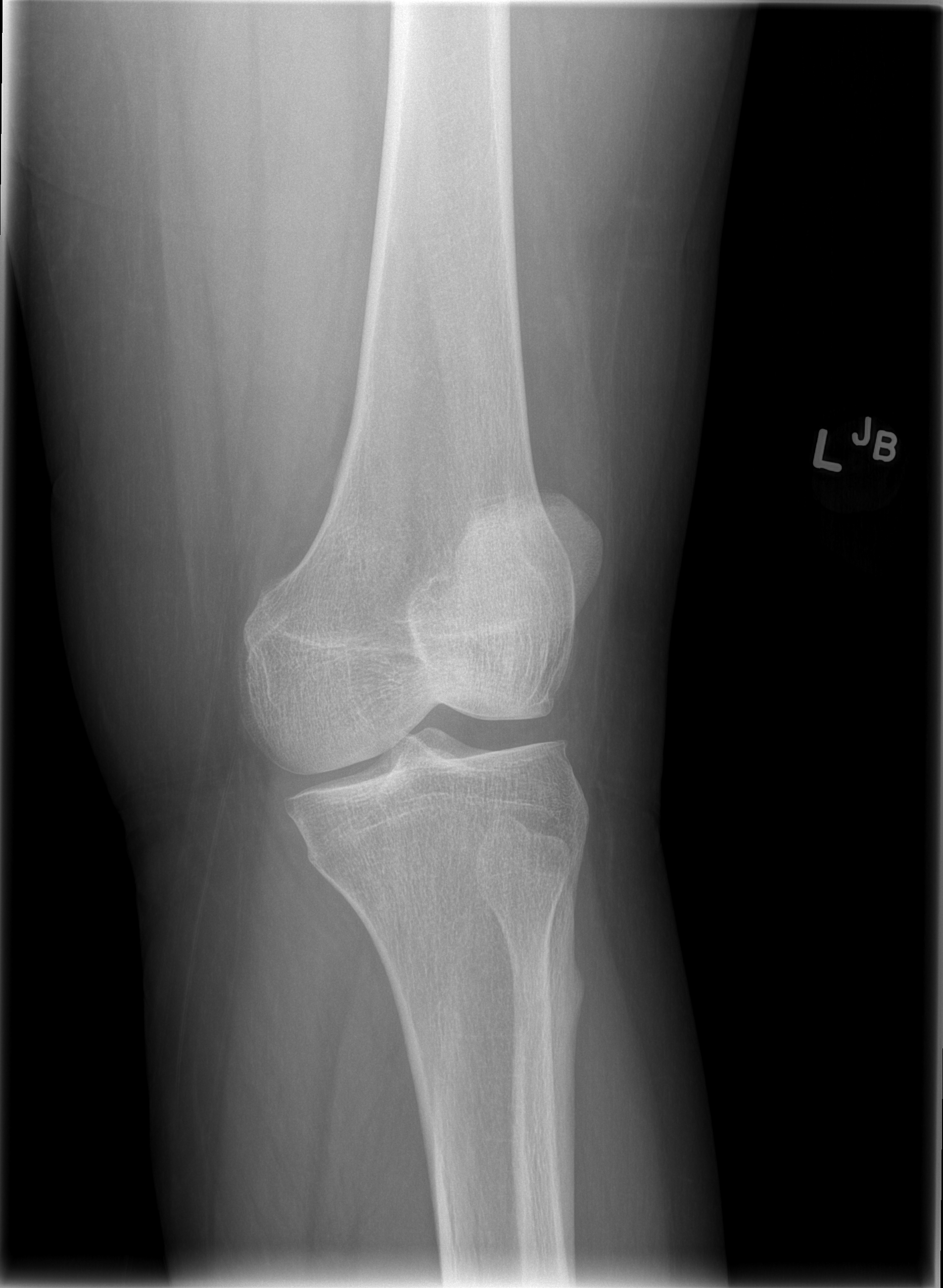

[t knee lat left]
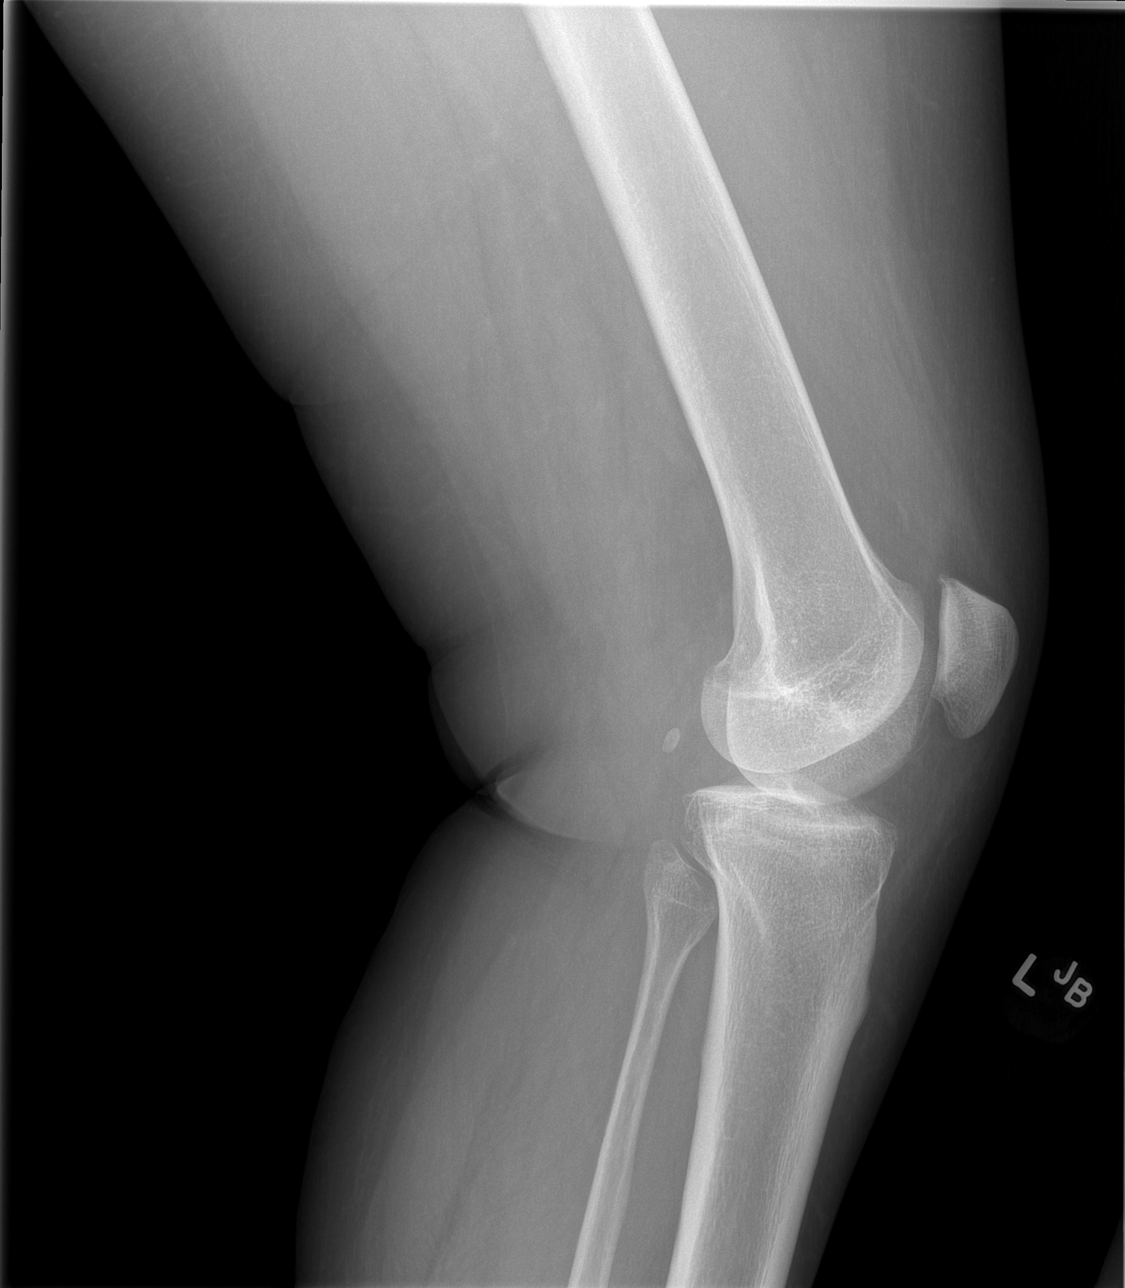

[4 of 4 positions shown; findings below may reference images not displayed]

FINDINGS: Minimal osteoarthritic changes present. No evidence of acute
fracture or dislocation. No significant joint effusion.
IMPRESSION: No acute findings.

## 2021-01-30 ENCOUNTER — Ambulatory Visit: Payer: POS | Admitting: Family Medicine

## 2021-01-30 DIAGNOSIS — Z0289 Encounter for other administrative examinations: Secondary | ICD-10-CM

## 2021-06-18 ENCOUNTER — Encounter: Payer: Self-pay | Admitting: Family Medicine

## 2021-06-18 ENCOUNTER — Ambulatory Visit: Payer: POS | Admitting: Family Medicine

## 2021-06-18 ENCOUNTER — Other Ambulatory Visit (HOSPITAL_COMMUNITY)
Admission: RE | Admit: 2021-06-18 | Discharge: 2021-06-18 | Disposition: A | Discharge status: Home / Self Care | Payer: POS | Source: Ambulatory Visit | Attending: Family Medicine | Admitting: Family Medicine

## 2021-06-18 ENCOUNTER — Other Ambulatory Visit: Payer: Self-pay

## 2021-06-18 VITALS — BP 128/80 | HR 73 | Temp 98.4°F | Resp 16 | Ht 67.0 in | Wt 205.4 lb

## 2021-06-18 DIAGNOSIS — Z202 Contact with and (suspected) exposure to infections with a predominantly sexual mode of transmission: Secondary | ICD-10-CM

## 2021-06-18 DIAGNOSIS — Z113 Encounter for screening for infections with a predominantly sexual mode of transmission: Secondary | ICD-10-CM

## 2021-06-18 DIAGNOSIS — N938 Other specified abnormal uterine and vaginal bleeding: Secondary | ICD-10-CM

## 2021-06-18 LAB — TSH: TSH: 5.63 u[IU]/mL — ABNORMAL HIGH (ref 0.35–5.50)

## 2021-06-18 LAB — POCT URINE PREGNANCY: Preg Test, Ur: NEGATIVE

## 2021-06-18 MED ORDER — CEFTRIAXONE SODIUM 500 MG IJ SOLR
500.0000 mg | Freq: Once | INTRAMUSCULAR | Status: AC
  Administered 2021-06-18: 500 mg via INTRAMUSCULAR

## 2021-06-18 NOTE — Progress Notes (Signed)
ACUTE VISIT Chief Complaint  Patient presents with   std testing    Also requesting ngu testing    HPI: [name] is a 45 y.o. female, who is here today requesting STD testing. Her partner was evaluated because dysuria and dx'ed with gonorrhea. She does not use condoms.  She has had whitish vaginal discharge and mild pruritus. Intermittent. She has not identified exacerbating or alleviating factors..  Vaginal Discharge The patient's primary symptoms include missed menses, vaginal bleeding and vaginal discharge. The patient's pertinent negatives include no genital lesions, genital odor or genital rash. The problem has been unchanged. The patient is experiencing no pain. She is not pregnant. Pertinent negatives include no abdominal pain, chills, dysuria, fever, flank pain, frequency, joint swelling or sore throat.  She has not tried OTC medications.  She has been treated for trichomonas years ago.  LMP 05/15/21, she had vaginal bleeding x 2 last month.  Review of Systems  Constitutional:  Negative for appetite change, chills and fever.  HENT:  Negative for sore throat.   Eyes:  Negative for discharge and redness.  Gastrointestinal:  Negative for abdominal pain.  Genitourinary:  Positive for missed menses and vaginal discharge. Negative for dysuria, flank pain and frequency.  Musculoskeletal:  Negative for gait problem and myalgias.  Neurological:  Negative for syncope and weakness.  Rest see pertinent positives and negatives per HPI.  Current Outpatient Medications on File Prior to Visit  Medication Sig Dispense Refill   diclofenac sodium (VOLTAREN) 1 % GEL Apply 2 g topically 4 (four) times daily. 100 g 0   glucosamine-chondroitin 500-400 MG tablet Take 2 tablets by mouth daily.     No current facility-administered medications on file prior to visit.   Past Medical History:  Diagnosis Date   GERD (gastroesophageal reflux disease)    No Known  Allergies  Social History   Socioeconomic History   Marital status: Married    Spouse name: Not on file   Number of children: Not on file   Years of education: Not on file   Highest education level: Not on file  Occupational History   Not on file  Tobacco Use   Smoking status: Never   Smokeless tobacco: Never  Substance and Sexual Activity   Alcohol use: No    Alcohol/week: 0.0 standard drinks   Drug use: No   Sexual activity: Yes  Other Topics Concern   Not on file  Social History Narrative   Not on file   Social Determinants of Health   Financial Resource Strain: Not on file  Food Insecurity: Not on file  Transportation Needs: Not on file  Physical Activity: Not on file  Stress: Not on file  Social Connections: Not on file   Vitals:   06/18/21 0940  BP: 128/80  Pulse: 73  Resp: 16  Temp: 98.4 F (36.9 C)  SpO2: 99%   Body mass index is 32.17 kg/m.  Physical Exam Vitals and nursing note reviewed. Exam conducted with a chaperone present.  Constitutional:      General: She is not in acute distress.    Appearance: She is well-developed.  HENT:     Head: Normocephalic and atraumatic.  Eyes:     Conjunctiva/sclera: Conjunctivae normal.  Pulmonary:     Effort: Pulmonary effort is normal. No respiratory distress.  Abdominal:     Palpations: Abdomen is soft. There is no mass.     Tenderness: There is no abdominal tenderness.  Genitourinary:  Exam position: Lithotomy position.     Labia:        Right: No rash, tenderness or lesion.        Left: No rash, tenderness or lesion.      Urethra: No urethral swelling or urethral lesion.     Vagina: No erythema, tenderness or bleeding.  Lymphadenopathy:     Cervical: No cervical adenopathy.     Lower Body: No right inguinal adenopathy. No left inguinal adenopathy.  Skin:    General: Skin is warm.     Findings: No rash.  Neurological:     Mental Status: She is alert and oriented to person, place, and time.      Gait: Gait normal.  Psychiatric:     Comments: Well groomed, good eye contact.   ASSESSMENT AND PLAN:  Mr.Julie Love was seen today for std testing.  Diagnoses and all orders for this visit: Orders Placed This Encounter  Procedures   HIV antibody (with reflex)   RPR   TSH   POCT urine pregnancy   Lab Results  Component Value Date   TSH 5.63 (H) 06/18/2021   Exposure to gonorrhea We discussed signs and symptoms of gonorrhea. Empiric treatment with Rocephin given here in the office after verbal consent. We will give further recommendations according to lab results.  -     cefTRIAXone (ROCEPHIN) injection 500 mg  Screen for STD (sexually transmitted disease) -     HIV antibody (with reflex) -     RPR  DUB (dysfunctional uterine bleeding) Pregnancy test negative. Possible etiologies discussed. If recurrent we can consider hormonal therapy. Further recommendations according to lab results.  Return if symptoms worsen or fail to improve.   Kiowa Hollar G. [place], MD  [facility] [place] office.

## 2021-06-18 NOTE — Patient Instructions (Signed)
A few things to remember from today's visit:  Exposure to STD - Plan: Cervicovaginal ancillary only  Exposure to gonorrhea - Plan: Cervicovaginal ancillary only  Screen for STD (sexually transmitted disease) - Plan: HIV antibody (with reflex), RPR  If you need refills please call your pharmacy. Do not use My Chart to request refills or for acute issues that need immediate attention.   Gonorrhea Gonorrhea is an STI (sexually transmitted infection) that can infect both men and women. If left untreated, this infection can: Damage the female or female reproductive organs. Cause women and men to be unable to have children (infertility). Harm an unborn baby if an infected woman is pregnant. It is important to get treatment for gonorrhea as soon as possible. All of your sex partners may also need to be treated for the infection. What are the causes? This condition is caused by bacteria called Neisseria gonorrhoeae. The infection is spread from person to person through sexual contact, including oral, anal, and vaginal sex. A newborn can get the infection from his or her mother during birth. What increases the risk? The following factors may make you more likely to develop this condition: Being a woman who is younger than age 32 and who is sexually active. Being a man who is gay or bisexual and who has sex with men. Having a new sex partner or having multiple partners. Having a sex partner who has an STI. Not using condoms correctly or not using condoms every time you have sex. Having a history of STIs. Exchanging sex for money or drugs. What are the signs or symptoms? When symptoms occur, they may be different for women and men. Symptoms may include: Abnormal discharge from the penis or vagina. The discharge may be cloudy, thick, or yellow-green in color. Pain or burning when you urinate. Irritation, pain, bleeding, or discharge from the rectum. This may occur if the infection was spread by  anal sex. Sore throat or swollen lymph nodes in the neck. This may occur if the infection was spread by oral sex. Pain or swelling in the testicles, in men. Bleeding between menstrual periods, in women. In some cases, there are no symptoms. How is this diagnosed? This condition is diagnosed based on: A physical exam. A sample of discharge that is looked at under a microscope to find any bacteria. The discharge may be taken from the penis, vagina, throat, or rectum. Urine tests. Not all test results will be available during your visit. How is this treated? This condition is treated with antibiotic medicines. It is important to start treatment as soon as possible. Early treatment may prevent some problems from developing. You should not have sex during treatment. All types of sexual activity should be avoided for at least 7 days after treatment is complete and until your sex partner or partners have been treated. Follow these instructions at home: Take over-the-counter and prescription medicines as told by your health care provider. Finish all antibiotic medicine even when you start to feel better. Do not have sex during treatment. Do not have sex until at least 7 days after you and your partner or partners have finished treatment and your health care provider says it is okay. It is up to you to get your test results. Ask your health care provider, or the department that is doing the test, when your results will be ready. If you get a positive result on your gonorrhea test, tell your recent sex partners. These include any partners for  oral, anal, or vaginal sex. They need to be checked for gonorrhea even if they do not have symptoms. They may need treatment, even if they get negative results on their gonorrhea tests. Drink enough fluid to keep your urine pale yellow. Keep all follow-up visits as told by your health care provider. This is important. How is this prevented?  Use latex condoms  correctly every time you have sex. Ask if your sex partner or partners have been tested for STIs and had negative results. Avoid having multiple sex partners. Get regular health screenings to check for STIs. Contact a health care provider if: Your symptoms do not get better after a few days of taking antibiotics. Your symptoms get worse. You develop new symptoms, including: Eye irritation. Joint pain or swelling. Unusual rashes. You have a fever. Summary Gonorrhea is an STI (sexually transmitted infection) that can infect both men and women. This infection is spread from person to person through sexual contact, including oral, anal, and vaginal sex. A newborn can get the infection from his or her mother during birth. Symptoms include abnormal discharge, pain or burning while urinating, or pain in the rectum. This condition is treated with antibiotic medicines. Do not have sex until at least 7 days after both you and any sex partners have completed antibiotic treatment. Tell your health care provider if your symptoms get worse or you have new symptoms. This information is not intended to replace advice given to you by your health care provider. Make sure you discuss any questions you have with your health care provider. Document Revised: 09/09/2019 Document Reviewed: 09/09/2019 Elsevier Patient Education  2022 [employer].  Please be sure medication list is accurate. If a new problem present, please set up appointment sooner than planned today.

## 2021-06-19 LAB — HIV ANTIBODY (ROUTINE TESTING W REFLEX): HIV 1&2 Ab, 4th Generation: NONREACTIVE

## 2021-06-19 LAB — CERVICOVAGINAL ANCILLARY ONLY
Bacterial Vaginitis (gardnerella): POSITIVE — AB
Candida Glabrata: NEGATIVE
Candida Vaginitis: NEGATIVE
Chlamydia: NEGATIVE
Comment: NEGATIVE
Comment: NEGATIVE
Comment: NEGATIVE
Comment: NEGATIVE
Comment: NEGATIVE
Comment: NORMAL
Neisseria Gonorrhea: NEGATIVE
Trichomonas: NEGATIVE

## 2021-06-19 LAB — RPR: RPR Ser Ql: NONREACTIVE

## 2021-06-25 ENCOUNTER — Other Ambulatory Visit: Payer: Self-pay

## 2021-06-25 MED ORDER — METRONIDAZOLE 500 MG PO TABS: 500.0000 mg | ORAL_TABLET | Freq: Two times a day (BID) | ORAL | 0 refills | Status: AC

## 2021-06-26 ENCOUNTER — Encounter: Payer: POS | Admitting: Family Medicine

## 2021-06-27 ENCOUNTER — Telehealth: Payer: Self-pay | Admitting: Family Medicine

## 2021-06-27 NOTE — Telephone Encounter (Signed)
PT called to advise that she would like to get a copy of her STD test results. She would like a call when it is ready for pickup.

## 2021-06-28 NOTE — Telephone Encounter (Signed)
I left patient a voicemail letting her know that a copy of her labs is up front for pick up.

## 2021-07-23 NOTE — Progress Notes (Signed)
HPI: [name] is a 45 y.o. female, who is here today for her routine physical.  Last CPE: 04/27/20  Regular exercise 3 or more time per week: She does not have a routine but she is active at work, night shift. Floor exercises and arms ROM. Following a healthy diet:Vegan for 2 years.She has decreased carbs intake, she does not drink sodas or juices. She just drinks water.  She lives with daughter and son.  Chronic medical problems: GERD.  Pap smear: 04/27/20  Hx of STD's:Trichomonas.  Birth control: BTL. M:15 G:6 A:3 L:3  Immunization History  Administered Date(s) Administered   Influenza-Unspecified 06/29/2018   Tdap 09/12/2014   Mammogram: Missed appt. Colonoscopy: N/A DEXA: N/A  Hep C screening: 04/27/20 NR Having trouble sleeping, 4-5 hours. She sleeps well during days off,"like a baby."  She has no concerns today.  Review of Systems  Constitutional:  Positive for fatigue. Negative for appetite change and fever.  HENT:  Negative for hearing loss, mouth sores, trouble swallowing and voice change.   Eyes:  Negative for photophobia and visual disturbance.  Respiratory:  Negative for cough, shortness of breath and wheezing.   Cardiovascular:  Negative for chest pain and leg swelling.  Gastrointestinal:  Negative for abdominal pain, nausea and vomiting.       No changes in bowel habits.  Endocrine: Negative for cold intolerance and heat intolerance.  Genitourinary:  Negative for decreased urine volume, dysuria, hematuria, vaginal bleeding and vaginal discharge.  Musculoskeletal:  Negative for arthralgias, back pain and neck pain.  Skin:  Negative for color change and rash.  Neurological:  Negative for syncope, weakness and headaches.  Psychiatric/Behavioral:  Positive for sleep disturbance. Negative for confusion. The patient is not nervous/anxious.   All other systems reviewed and are negative.  Current Outpatient Medications on File Prior to Visit   Medication Sig Dispense Refill   diclofenac sodium (VOLTAREN) 1 % GEL Apply 2 g topically 4 (four) times daily. 100 g 0   glucosamine-chondroitin 500-400 MG tablet Take 2 tablets by mouth daily.     No current facility-administered medications on file prior to visit.   Past Medical History:  Diagnosis Date   GERD (gastroesophageal reflux disease)    Past Surgical History:  Procedure Laterality Date   CESAREAN SECTION     FOOT SURGERY Right    TUBAL LIGATION     No Known Allergies  Family History  Problem Relation Age of Onset   Hypertension Mother    Cancer Neg Hx    Heart disease Neg Hx    Diabetes Neg Hx    Social History   Socioeconomic History   Marital status: Married    Spouse name: Not on file   Number of children: Not on file   Years of education: Not on file   Highest education level: Not on file  Occupational History   Not on file  Tobacco Use   Smoking status: Never   Smokeless tobacco: Never  Substance and Sexual Activity   Alcohol use: No    Alcohol/week: 0.0 standard drinks   Drug use: No   Sexual activity: Yes  Other Topics Concern   Not on file  Social History Narrative   Not on file   Social Determinants of Health   Financial Resource Strain: Not on file  Food Insecurity: Not on file  Transportation Needs: Not on file  Physical Activity: Not on file  Stress: Not on file  Social Connections:  Not on file   Vitals:   07/24/21 0704  BP: 120/70  Pulse: 74  Resp: 16  SpO2: 97%   Body mass index is 31.32 kg/m.  Wt Readings from Last 3 Encounters:  07/24/21 200 lb (90.7 kg)  06/18/21 205 lb 6 oz (93.2 kg)  08/06/20 197 lb (89.4 kg)   Physical Exam Vitals and nursing note reviewed.  Constitutional:      General: She is not in acute distress.    Appearance: She is well-developed.  HENT:     Head: Normocephalic and atraumatic.     Right Ear: Hearing, tympanic membrane, ear canal and external ear normal.     Left Ear: Hearing,  tympanic membrane, ear canal and external ear normal.     Mouth/Throat:     Mouth: Mucous membranes are moist.     Pharynx: Oropharynx is clear. Uvula midline.  Eyes:     Extraocular Movements: Extraocular movements intact.     Conjunctiva/sclera: Conjunctivae normal.     Pupils: Pupils are equal, round, and reactive to light.  Neck:     Thyroid: No thyromegaly.     Trachea: No tracheal deviation.  Cardiovascular:     Rate and Rhythm: Normal rate and regular rhythm.     Pulses:          Dorsalis pedis pulses are 2+ on the right side and 2+ on the left side.     Heart sounds: No murmur heard.    Comments: Varicose veins LE, bilateral. Pulmonary:     Effort: Pulmonary effort is normal. No respiratory distress.     Breath sounds: Normal breath sounds.  Abdominal:     Palpations: Abdomen is soft. There is no hepatomegaly or mass.     Tenderness: There is no abdominal tenderness.  Genitourinary:    Comments: No concerns. Musculoskeletal:     Comments: No major deformity or signs of synovitis appreciated.  Lymphadenopathy:     Cervical: No cervical adenopathy.     Upper Body:     Right upper body: No supraclavicular adenopathy.     Left upper body: No supraclavicular adenopathy.  Skin:    General: Skin is warm.     Findings: No erythema or rash.  Neurological:     General: No focal deficit present.     Mental Status: She is alert and oriented to person, place, and time.     Cranial Nerves: No cranial nerve deficit.     Coordination: Coordination normal.     Gait: Gait normal.     Deep Tendon Reflexes:     Reflex Scores:      Bicep reflexes are 2+ on the right side and 2+ on the left side.      Patellar reflexes are 2+ on the right side and 2+ on the left side. Psychiatric:        Speech: Speech normal.     Comments: Well groomed, good eye contact.   ASSESSMENT AND PLAN:  [name] was here today annual physical examination.  Orders Placed This Encounter   Procedures   Basic Metabolic Panel   Lipid Panel   Hemoglobin A1c   Ambulatory referral to Gastroenterology   Lab Results  Component Value Date   CHOL 128 07/24/2021   HDL 40.20 07/24/2021   LDLCALC 79 07/24/2021   TRIG 43.0 07/24/2021   CHOLHDL 3 07/24/2021   Lab Results  Component Value Date   CREATININE 0.79 07/24/2021   BUN 12  07/24/2021   NA 134 (L) 07/24/2021   K 3.7 07/24/2021   CL 104 07/24/2021   CO2 24 07/24/2021   Lab Results  Component Value Date   HGBA1C 5.3 07/24/2021   Routine general medical examination at a health care facility We discussed the importance of regular physical activity and healthy diet for prevention of chronic illness and/or complications. Preventive guidelines reviewed. Instructed to re-schedule mammogram. Vaccination up to date.  Next CPE in a year. The ASCVD Risk score (Arnett DK, et al., 2019) failed to calculate for the following reasons:   The valid total cholesterol range is 130 to 320 mg/dL  Screening for endocrine, metabolic and immunity disorder -     Basic Metabolic Panel; Future -     Hemoglobin A1c; Future  Screening for lipoid disorders -     Lipid Panel; Future  Colon cancer screening -     Ambulatory referral to Gastroenterology  Return in 1 year (on 07/24/2022) for CPE.  Taiga Lupinacci G. Martin[place], MD  [facility]. [place] office.

## 2021-07-24 ENCOUNTER — Other Ambulatory Visit: Payer: Self-pay

## 2021-07-24 ENCOUNTER — Ambulatory Visit (INDEPENDENT_AMBULATORY_CARE_PROVIDER_SITE_OTHER): Payer: POS | Admitting: Family Medicine

## 2021-07-24 ENCOUNTER — Encounter: Payer: Self-pay | Admitting: Family Medicine

## 2021-07-24 VITALS — BP 120/70 | HR 74 | Resp 16 | Ht 67.0 in | Wt 200.0 lb

## 2021-07-24 DIAGNOSIS — Z130 Encounter for screening for diseases of the blood and blood-forming organs and certain disorders involving the immune mechanism: Secondary | ICD-10-CM | POA: Diagnosis not present

## 2021-07-24 DIAGNOSIS — Z13220 Encounter for screening for lipoid disorders: Secondary | ICD-10-CM | POA: Diagnosis not present

## 2021-07-24 DIAGNOSIS — Z13228 Encounter for screening for other metabolic disorders: Secondary | ICD-10-CM

## 2021-07-24 DIAGNOSIS — Z23 Encounter for immunization: Secondary | ICD-10-CM

## 2021-07-24 DIAGNOSIS — Z0000 Encounter for general adult medical examination without abnormal findings: Secondary | ICD-10-CM

## 2021-07-24 DIAGNOSIS — Z1211 Encounter for screening for malignant neoplasm of colon: Secondary | ICD-10-CM | POA: Diagnosis not present

## 2021-07-24 DIAGNOSIS — Z1329 Encounter for screening for other suspected endocrine disorder: Secondary | ICD-10-CM | POA: Diagnosis not present

## 2021-07-24 LAB — LIPID PANEL
Cholesterol: 128 mg/dL (ref 0–200)
HDL: 40.2 mg/dL (ref >39.00)
LDL Cholesterol: 79 mg/dL (ref 0–99)
NonHDL: 87.82
Total CHOL/HDL Ratio: 3
Triglycerides: 43 mg/dL (ref 0.0–149.0)
VLDL: 8.6 mg/dL (ref 0.0–40.0)

## 2021-07-24 LAB — BASIC METABOLIC PANEL
BUN: 12 mg/dL (ref 6–23)
CO2: 24 mEq/L (ref 19–32)
Calcium: 9.1 mg/dL (ref 8.4–10.5)
Chloride: 104 mEq/L (ref 96–112)
Creatinine, Ser: 0.79 mg/dL (ref 0.40–1.20)
GFR: 90.7 mL/min (ref >60.00)
Glucose, Bld: 79 mg/dL (ref 70–99)
Potassium: 3.7 mEq/L (ref 3.5–5.1)
Sodium: 134 mEq/L — ABNORMAL LOW (ref 135–145)

## 2021-07-24 LAB — HEMOGLOBIN A1C: Hgb A1c MFr Bld: 5.3 % (ref 4.6–6.5)

## 2021-07-24 NOTE — Patient Instructions (Addendum)
A few things to remember from today's visit:  Routine general medical examination at a health care facility  Screening for endocrine, metabolic and immunity disorder - Plan: Basic Metabolic Panel, Hemoglobin A1c  Screening for lipoid disorders - Plan: Lipid Panel  Colon cancer screening - Plan: Ambulatory referral to Gastroenterology  Do not use My Chart to request refills or for acute issues that need immediate attention.   Please be sure medication list is accurate. If a new problem present, please set up appointment sooner than planned today.  Health Maintenance, Female Adopting a healthy lifestyle and getting preventive care are important in promoting health and wellness. Ask your health care provider about: The right schedule for you to have regular tests and exams. Things you can do on your own to prevent diseases and keep yourself healthy. What should I know about diet, weight, and exercise? Eat a healthy diet  Eat a diet that includes plenty of vegetables, fruits, low-fat dairy products, and lean protein. Do not eat a lot of foods that are high in solid fats, added sugars, or sodium. Maintain a healthy weight Body mass index (BMI) is used to identify weight problems. It estimates body fat based on height and weight. Your health care provider can help determine your BMI and help you achieve or maintain a healthy weight. Get regular exercise Get regular exercise. This is one of the most important things you can do for your health. Most adults should: Exercise for at least 150 minutes each week. The exercise should increase your heart rate and make you sweat (moderate-intensity exercise). Do strengthening exercises at least twice a week. This is in addition to the moderate-intensity exercise. Spend less time sitting. Even light physical activity can be beneficial. Watch cholesterol and blood lipids Have your blood tested for lipids and cholesterol at 45 years of age, then have  this test every 5 years. Have your cholesterol levels checked more often if: Your lipid or cholesterol levels are high. You are older than 45 years of age. You are at high risk for heart disease. What should I know about cancer screening? Depending on your health history and family history, you may need to have cancer screening at various ages. This may include screening for: Breast cancer. Cervical cancer. Colorectal cancer. Skin cancer. Lung cancer. What should I know about heart disease, diabetes, and high blood pressure? Blood pressure and heart disease High blood pressure causes heart disease and increases the risk of stroke. This is more likely to develop in people who have high blood pressure readings, are of African descent, or are overweight. Have your blood pressure checked: Every 3-5 years if you are 22-28 years of age. Every year if you are 7 years old or older. Diabetes Have regular diabetes screenings. This checks your fasting blood sugar level. Have the screening done: Once every three years after age 38 if you are at a normal weight and have a low risk for diabetes. More often and at a younger age if you are overweight or have a high risk for diabetes. What should I know about preventing infection? Hepatitis B If you have a higher risk for hepatitis B, you should be screened for this virus. Talk with your health care provider to find out if you are at risk for hepatitis B infection. Hepatitis C Testing is recommended for: Everyone born from [birth year] through 1965. Anyone with known risk factors for hepatitis C. Sexually transmitted infections (STIs) Get screened for STIs, including gonorrhea and  chlamydia, if: You are sexually active and are younger than 45 years of age. You are older than 45 years of age and your health care provider tells you that you are at risk for this type of infection. Your sexual activity has changed since you were last screened, and you are at  increased risk for chlamydia or gonorrhea. Ask your health care provider if you are at risk. Ask your health care provider about whether you are at high risk for HIV. Your health care provider may recommend a prescription medicine to help prevent HIV infection. If you choose to take medicine to prevent HIV, you should first get tested for HIV. You should then be tested every 3 months for as long as you are taking the medicine. Pregnancy If you are about to stop having your period (premenopausal) and you may become pregnant, seek counseling before you get pregnant. Take 400 to 800 micrograms (mcg) of folic acid every day if you become pregnant. Ask for birth control (contraception) if you want to prevent pregnancy. Osteoporosis and menopause Osteoporosis is a disease in which the bones lose minerals and strength with aging. This can result in bone fractures. If you are 42 years old or older, or if you are at risk for osteoporosis and fractures, ask your health care provider if you should: Be screened for bone loss. Take a calcium or vitamin D supplement to lower your risk of fractures. Be given hormone replacement therapy (HRT) to treat symptoms of menopause. Follow these instructions at home: Lifestyle Do not use any products that contain nicotine or tobacco, such as cigarettes, e-cigarettes, and chewing tobacco. If you need help quitting, ask your health care provider. Do not use street drugs. Do not share needles. Ask your health care provider for help if you need support or information about quitting drugs. Alcohol use Do not drink alcohol if: Your health care provider tells you not to drink. You are pregnant, may be pregnant, or are planning to become pregnant. If you drink alcohol: Limit how much you use to 0-1 drink a day. Limit intake if you are breastfeeding. Be aware of how much alcohol is in your drink. In the [place] one drink equals one 12 oz bottle of beer (355 mL), one 5 oz glass  of wine (148 mL), or one 1 oz glass of hard liquor (44 mL). General instructions Schedule regular health, dental, and eye exams. Stay current with your vaccines. Tell your health care provider if: You often feel depressed. You have ever been abused or do not feel safe at home. Summary Adopting a healthy lifestyle and getting preventive care are important in promoting health and wellness. Follow your health care provider's instructions about healthy diet, exercising, and getting tested or screened for diseases. Follow your health care provider's instructions on monitoring your cholesterol and blood pressure. This information is not intended to replace advice given to you by your health care provider. Make sure you discuss any questions you have with your health care provider. Document Revised: 11/23/2020 Document Reviewed: 09/08/2018 Elsevier Patient Education  2022 [employer].

## 2021-12-17 ENCOUNTER — Encounter: Payer: Self-pay | Admitting: Family Medicine

## 2021-12-17 ENCOUNTER — Other Ambulatory Visit (HOSPITAL_COMMUNITY)
Admission: RE | Admit: 2021-12-17 | Discharge: 2021-12-17 | Disposition: A | Discharge status: Home / Self Care | Payer: Federal, State, Local not specified - PPO | Source: Ambulatory Visit | Attending: Family Medicine | Admitting: Family Medicine

## 2021-12-17 ENCOUNTER — Ambulatory Visit (INDEPENDENT_AMBULATORY_CARE_PROVIDER_SITE_OTHER): Payer: Federal, State, Local not specified - PPO | Admitting: Family Medicine

## 2021-12-17 VITALS — BP 124/80 | HR 79 | Resp 16 | Ht 67.0 in | Wt 198.4 lb

## 2021-12-17 DIAGNOSIS — N938 Other specified abnormal uterine and vaginal bleeding: Secondary | ICD-10-CM

## 2021-12-17 DIAGNOSIS — Z113 Encounter for screening for infections with a predominantly sexual mode of transmission: Secondary | ICD-10-CM | POA: Insufficient documentation

## 2021-12-17 DIAGNOSIS — Z124 Encounter for screening for malignant neoplasm of cervix: Secondary | ICD-10-CM

## 2021-12-17 DIAGNOSIS — R7989 Other specified abnormal findings of blood chemistry: Secondary | ICD-10-CM

## 2021-12-17 DIAGNOSIS — D509 Iron deficiency anemia, unspecified: Secondary | ICD-10-CM | POA: Diagnosis not present

## 2021-12-17 LAB — CBC
HCT: 35.6 % — ABNORMAL LOW (ref 36.0–46.0)
Hemoglobin: 11.7 g/dL — ABNORMAL LOW (ref 12.0–15.0)
MCHC: 32.8 g/dL (ref 30.0–36.0)
MCV: 89 fl (ref 78.0–100.0)
Platelets: 169 10*3/uL (ref 150.0–400.0)
RBC: 4 Mil/uL (ref 3.87–5.11)
RDW: 12.9 % (ref 11.5–15.5)
WBC: 3.1 10*3/uL — ABNORMAL LOW (ref 4.0–10.5)

## 2021-12-17 LAB — FERRITIN: Ferritin: 47.5 ng/mL (ref 10.0–291.0)

## 2021-12-17 LAB — T4, FREE: Free T4: 0.94 ng/dL (ref 0.60–1.60)

## 2021-12-17 LAB — TSH: TSH: 3.66 u[IU]/mL (ref 0.35–5.50)

## 2021-12-17 LAB — POCT URINE PREGNANCY: Preg Test, Ur: NEGATIVE

## 2021-12-17 LAB — IRON: Iron: 58 ug/dL (ref 42–145)

## 2021-12-17 LAB — T3, FREE: T3, Free: 3.2 pg/mL (ref 2.3–4.2)

## 2021-12-17 MED ORDER — MEDROXYPROGESTERONE ACETATE 150 MG/ML IM SUSP
150.0000 mg | Freq: Once | INTRAMUSCULAR | Status: AC
  Administered 2021-12-17: 150 mg via INTRAMUSCULAR

## 2021-12-17 NOTE — Progress Notes (Signed)
? ?ACUTE VISIT ?Chief Complaint  ?Patient presents with  ? Menorrhagia  ?  Cycle was supposed to start on 3/25, last cycle ended 3/1. Cycle started back up on 3/14 with spotting through the 19th, and then nothing yesterday. Bleeding started again today.  ? ?HPI: ?Julie Love is a 46 y.o. female, who is here today with above complaint. ?She has had similar episodes in the past, DUB, transvaginal/pelvic US was ordered in 07/2010 but not done. ?Her menses are usually heavy, 30/3. ? ?Vaginal Bleeding ?The patient's primary symptoms include vaginal bleeding. This is a recurrent problem. The current episode started 1 to 4 weeks ago. The problem occurs intermittently. The problem has been waxing and waning. The patient is experiencing no pain. She is not pregnant. Pertinent negatives include no abdominal pain, back pain, chills, constipation, diarrhea, dysuria, fever, flank pain, frequency, headaches, hematuria, joint swelling, nausea, painful intercourse, rash, sore throat or urgency. The vaginal discharge was normal. The vaginal bleeding is heavier than menses. She has been passing clots. She has not been passing tissue. Nothing aggravates the symptoms. She has tried nothing for the symptoms. She is sexually active. No, her partner does not have an STD. She uses tubal ligation for contraception. Her menstrual history has been irregular.  ?Spotting initially, heavy flow intermittently. ?Today she saw a clot. ? ?Pap smear: 04/27/20  ?Hx of STD's:Trichomonas. ?M:15 ?G:6 ?A:3 ?L:3 ? ?She has had mildly abnormal TSH. ?Negative for changes in bowel habits,cold/heat intolerance,or abnormal wt changes. ?+ Fatigue. ? ?Lab Results  ?Component Value Date  ? TSH 5.63 (H) 06/18/2021  ? ?Iron def anemia, she is not on iron supplementation. ?Negative for SOB,palpitations,or pica. ? ?Lab Results  ?Component Value Date  ? WBC 4.1 08/06/2020  ? HGB 10.9 (L) 08/06/2020  ? HCT 33.3 (L) 08/06/2020  ? MCV 89.3 08/06/2020  ? PLT 188  08/06/2020  ? ?Review of Systems  ?Constitutional:  Positive for fatigue. Negative for chills and fever.  ?HENT:  Negative for mouth sores, nosebleeds and sore throat.   ?Cardiovascular:  Negative for chest pain and leg swelling.  ?Gastrointestinal:  Negative for abdominal pain, constipation, diarrhea and nausea.  ?Genitourinary:  Positive for vaginal bleeding. Negative for dysuria, flank pain, frequency, hematuria and urgency.  ?Musculoskeletal:  Negative for back pain.  ?Skin:  Negative for pallor and rash.  ?Neurological:  Negative for syncope and headaches.  ?Hematological:  Negative for adenopathy. Does not bruise/bleed easily.  ?Rest see pertinent positives and negatives per HPI. ? ?Current Outpatient Medications on File Prior to Visit  ?Medication Sig Dispense Refill  ? diclofenac sodium (VOLTAREN) 1 % GEL Apply 2 g topically 4 (four) times daily. 100 g 0  ? glucosamine-chondroitin 500-400 MG tablet Take 2 tablets by mouth daily.    ? ?No current facility-administered medications on file prior to visit.  ? ?Past Medical History:  ?Diagnosis Date  ? GERD (gastroesophageal reflux disease)   ? ?No Known Allergies ? ?Social History  ? ?Socioeconomic History  ? Marital status: Married  ?  Spouse name: Not on file  ? Number of children: Not on file  ? Years of education: Not on file  ? Highest education level: Not on file  ?Occupational History  ? Not on file  ?Tobacco Use  ? Smoking status: Never  ? Smokeless tobacco: Never  ?Substance and Sexual Activity  ? Alcohol use: No  ?  Alcohol/week: 0.0 standard drinks  ? Drug use: No  ? Sexual activity: Yes  ?  Other Topics Concern  ? Not on file  ?Social History Narrative  ? Not on file  ? ?Social Determinants of Health  ? ?Financial Resource Strain: Not on file  ?Food Insecurity: Not on file  ?Transportation Needs: Not on file  ?Physical Activity: Not on file  ?Stress: Not on file  ?Social Connections: Not on file  ? ?Vitals:  ? 12/17/21 0851  ?BP: 124/80  ?Pulse: 79   ?Resp: 16  ?SpO2: 99%  ? ?Body mass index is 31.07 kg/m?. ? ?Physical Exam ?Vitals and nursing note reviewed. Exam conducted with a chaperone present.  ?Constitutional:   ?   General: She is not in acute distress. ?   Appearance: She is well-developed.  ?HENT:  ?   Head: Normocephalic and atraumatic.  ?   Mouth/Throat:  ?   Mouth: Mucous membranes are moist.  ?   Pharynx: Oropharynx is clear.  ?Eyes:  ?   Conjunctiva/sclera: Conjunctivae normal.  ?Cardiovascular:  ?   Rate and Rhythm: Normal rate and regular rhythm.  ?   Heart sounds: No murmur heard. ?Pulmonary:  ?   Effort: Pulmonary effort is normal. No respiratory distress.  ?   Breath sounds: Normal breath sounds.  ?Abdominal:  ?   Palpations: Abdomen is soft. There is no mass.  ?   Tenderness: There is no abdominal tenderness.  ?Genitourinary: ?   General: Normal vulva.  ?   Exam position: Lithotomy position.  ?   Labia:     ?   Right: No rash, tenderness, lesion or injury.     ?   Left: No rash, tenderness, lesion or injury.   ?   Vagina: No vaginal discharge, erythema, tenderness or bleeding.  ?   Cervix: Cervical bleeding present. No cervical motion tenderness, discharge, friability, lesion or erythema.  ?   Uterus: Not enlarged and not tender.   ?   Adnexa:     ?   Right: No mass or tenderness.      ?   Left: No mass or tenderness.    ?Lymphadenopathy:  ?   Cervical: No cervical adenopathy.  ?   Lower Body: No right inguinal adenopathy. No left inguinal adenopathy.  ?Skin: ?   General: Skin is warm.  ?   Findings: No erythema or rash.  ?Neurological:  ?   General: No focal deficit present.  ?   Mental Status: She is alert and oriented to person, place, and time.  ?   Cranial Nerves: No cranial nerve deficit.  ?   Gait: Gait normal.  ?Psychiatric:  ?   Comments: Well groomed, good eye contact.  ? ?ASSESSMENT AND PLAN: ? ?Julie Love was seen today for menorrhagia. ? ?Diagnoses and all orders for this visit: ?Orders Placed This Encounter  ?Procedures  ? CBC   ? T4, free  ? T3, free  ? TSH  ? Iron  ? Ferritin  ? Ambulatory referral to Gynecology  ? POCT urine pregnancy  ? ?Lab Results  ?Component Value Date  ? TSH 3.66 12/17/2021  ? ?Lab Results  ?Component Value Date  ? WBC 3.1 (L) 12/17/2021  ? HGB 11.7 (L) 12/17/2021  ? HCT 35.6 (L) 12/17/2021  ? MCV 89.0 12/17/2021  ? PLT 169.0 12/17/2021  ? ?DUB (dysfunctional uterine bleeding) ?Causing anemia. ?We reviewed possible etiologies. ?Hx of trich, so STD testing ordered today. ?Pap smear collected. ?Pregnancy test negative. ? ?After discussion of some options in regard to hormonal therapy, she would like  to proceed with depo Provera injection. ?Gyn referral placed, she may need an endometrial Bx. ? ?Iron deficiency anemia, unspecified iron deficiency anemia type ?Mild and attributed to heavy menses/DUB. ?GI referral has already been placed for colonoscopy, she missed call and planning on calling back to schedule procedure. ?She is not on iron supplementation, further recommendations according to CBC results. ? ?Abnormal TSH ?Mild and intermittently for a while. ?Further recommendations according to TSH result. ? ?Cervical cancer screening ?-     PAP [White] ? ?Return if symptoms worsen or fail to improve. ? ?Adalynne Steffensmeier G. [place], MD ? ?[place]. ?[place] office. ? ?Discharge Instructions   ?None ?  ? ? ? ? ? ? ? ? ? ? ? ? ?

## 2021-12-17 NOTE — Patient Instructions (Addendum)
A few things to remember from today's visit: ? ? ?DUB (dysfunctional uterine bleeding) - Plan: Ambulatory referral to Gynecology, PAP [[place]] ? ?Iron deficiency anemia, unspecified iron deficiency anemia type - Plan: CBC, TSH, Iron, Ferritin ? ?Abnormal TSH - Plan: T4, free, T3, free, TSH ? ?Cervical cancer screening - Plan: PAP [[place]] ? ?If you need refills please call your pharmacy. ?Do not use My Chart to request refills or for acute issues that need immediate attention. ?  ?Because you may need an endometrial biopsy I am sending you to gynecologist. ?Today we are doing labs to evaluate for possible causes. ? ?Please call gastroenterologist to schedule your colonoscopy. ?Today depo provera injection given. ?Please be sure medication list is accurate. ?If a new problem present, please set up appointment sooner than planned today. ? ?Abnormal Uterine Bleeding ?Abnormal uterine bleeding means bleeding more than normal from your womb (uterus). It can include: ?Bleeding after sex. ?Bleeding between monthly (menstrual) periods. ?Bleeding that is heavier than normal. ?Monthly periods that last longer than normal. ?Bleeding after you have stopped having your monthly period (menopause). ?You should see a doctor for any kind of bleeding that is not normal. Treatment depends on the cause of your bleeding and how much you bleed. ?Follow these instructions at home: ?Medicines ?Take over-the-counter and prescription medicines only as told by your doctor. ?Ask your doctor about: ?Taking medicines such as aspirin and ibuprofen. Do not take these medicines unless your doctor tells you to take them. ?Taking over-the-counter medicines, vitamins, herbs, and supplements. ?You may be given iron pills. Take them as told by your doctor. ?Managing constipation ?If you take iron pills, you may need to take these actions to prevent or treat trouble pooping (constipation): ?Drink enough fluid to keep your pee (urine) pale  yellow. ?Take over-the-counter or prescription medicines. ?Eat foods that are high in fiber. These include beans, whole grains, and fresh fruits and vegetables. ?Limit foods that are high in fat and sugar. These include fried or sweet foods. ?Activity ?Change your activity to decrease bleeding if you need to change your sanitary pad more than one time every 2 hours: ?Lie in bed with your feet raised (elevated). ?Place a cold pack on your lower belly. ?Rest as much as you are able until the bleeding stops or slows down. ?General instructions ?Do not use tampons, douche, or have sex until your doctor says these things are okay. ?Change your pads often. ?Get regular exams. These include: ?Pelvic exams. ?Screenings for cancer of the cervix. ?It is up to you to get the results of any tests that are done. Ask how to get your results when they are ready. ?Watch for any changes in your bleeding. For 2 months, write down: ?When your monthly period starts. ?When your monthly period ends. ?When you get any abnormal bleeding from your vagina. ?What problems you notice. ?Keep all follow-up visits. ?Contact a doctor if: ?The bleeding lasts more than one week. ?You feel dizzy at times. ?You feel like you may vomit (nausea). ?You vomit. ?You feel light-headed or weak. ?Your symptoms get worse. ?Get help right away if: ?You faint. ?You have to change pads every hour. ?You have pain in your belly. ?You have a fever or chills. ?You get sweaty or weak. ?You pass large blood clots from your vagina. ?These symptoms may be an emergency. Get help right away. Call your local emergency services (911 in the [place] ?Do not wait to see if the symptoms will go  away. ?Do not drive yourself to the hospital. ?Summary ?Abnormal uterine bleeding means bleeding more than normal from your womb (uterus). ?Any kind of bleeding that is not normal should be checked by a doctor. ?Treatment depends on the cause of your bleeding and how much you bleed. ?Get  help right away if you faint, you have to change pads every hour, or you pass large blood clots from your vagina. ?This information is not intended to replace advice given to you by your health care provider. Make sure you discuss any questions you have with your health care provider. ?Document Revised: 01/15/2021 Document Reviewed: 01/15/2021 ?Elsevier Patient Education ? 2022 [place]. ? ? ? ? ? ? ?

## 2021-12-19 ENCOUNTER — Encounter: Payer: Self-pay | Admitting: Family Medicine

## 2021-12-23 LAB — CYTOLOGY - PAP
Chlamydia: NEGATIVE
Comment: NEGATIVE
Comment: NEGATIVE
Comment: NEGATIVE
Comment: NORMAL
High risk HPV: NEGATIVE
Neisseria Gonorrhea: NEGATIVE
Trichomonas: NEGATIVE

## 2022-01-17 ENCOUNTER — Telehealth: Payer: Self-pay | Admitting: Family Medicine

## 2022-01-17 DIAGNOSIS — N938 Other specified abnormal uterine and vaginal bleeding: Secondary | ICD-10-CM

## 2022-01-17 NOTE — Telephone Encounter (Signed)
Referral entered  

## 2022-01-17 NOTE — Telephone Encounter (Signed)
Pt calling because referral [phone] has been closed. Record shows 3 attempts to schedule with no patient contact. Per referral coordinator, please reissue referral.  ?

## 2022-02-07 NOTE — Progress Notes (Deleted)
    ACUTE VISIT No chief complaint on file.  HPI: Ms.Brinlee ZISSEL BIEDERMAN is a 46 y.o. female, who is here today complaining of *** HPI  Review of Systems Rest see pertinent positives and negatives per HPI.  Current Outpatient Medications on File Prior to Visit  Medication Sig Dispense Refill   diclofenac sodium (VOLTAREN) 1 % GEL Apply 2 g topically 4 (four) times daily. 100 g 0   glucosamine-chondroitin 500-400 MG tablet Take 2 tablets by mouth daily.     No current facility-administered medications on file prior to visit.     Past Medical History:  Diagnosis Date   GERD (gastroesophageal reflux disease)    No Known Allergies  Social History   Socioeconomic History   Marital status: Married    Spouse name: Not on file   Number of children: Not on file   Years of education: Not on file   Highest education level: Not on file  Occupational History   Not on file  Tobacco Use   Smoking status: Never   Smokeless tobacco: Never  Substance and Sexual Activity   Alcohol use: No    Alcohol/week: 0.0 standard drinks   Drug use: No   Sexual activity: Yes  Other Topics Concern   Not on file  Social History Narrative   Not on file   Social Determinants of Health   Financial Resource Strain: Not on file  Food Insecurity: Not on file  Transportation Needs: Not on file  Physical Activity: Not on file  Stress: Not on file  Social Connections: Not on file    There were no vitals filed for this visit. There is no height or weight on file to calculate BMI.  Physical Exam  ASSESSMENT AND PLAN:  There are no diagnoses linked to this encounter.   No follow-ups on file.   Betty G. Martinique, MD  [facility]. [place] office.  Discharge Instructions   None

## 2022-02-10 ENCOUNTER — Ambulatory Visit: Payer: Federal, State, Local not specified - PPO | Admitting: Family Medicine

## 2022-02-12 NOTE — Progress Notes (Deleted)
    ACUTE VISIT No chief complaint on file.  HPI: [name] is a 46 y.o. female, who is here today complaining of *** HPI  Review of Systems Rest see pertinent positives and negatives per HPI.  Current Outpatient Medications on File Prior to Visit  Medication Sig Dispense Refill   diclofenac sodium (VOLTAREN) 1 % GEL Apply 2 g topically 4 (four) times daily. 100 g 0   glucosamine-chondroitin 500-400 MG tablet Take 2 tablets by mouth daily.     No current facility-administered medications on file prior to visit.     Past Medical History:  Diagnosis Date   GERD (gastroesophageal reflux disease)    No Known Allergies  Social History   Socioeconomic History   Marital status: Married    Spouse name: Not on file   Number of children: Not on file   Years of education: Not on file   Highest education level: Not on file  Occupational History   Not on file  Tobacco Use   Smoking status: Never   Smokeless tobacco: Never  Substance and Sexual Activity   Alcohol use: No    Alcohol/week: 0.0 standard drinks   Drug use: No   Sexual activity: Yes  Other Topics Concern   Not on file  Social History Narrative   Not on file   Social Determinants of Health   Financial Resource Strain: Not on file  Food Insecurity: Not on file  Transportation Needs: Not on file  Physical Activity: Not on file  Stress: Not on file  Social Connections: Not on file    There were no vitals filed for this visit. There is no height or weight on file to calculate BMI.  Physical Exam  ASSESSMENT AND PLAN:  There are no diagnoses linked to this encounter.   No follow-ups on file.   Betty G. Martinique, MD  [facility]. [place] office.  Discharge Instructions   None

## 2022-02-14 ENCOUNTER — Ambulatory Visit: Payer: Federal, State, Local not specified - PPO | Admitting: Family Medicine

## 2022-02-14 NOTE — Progress Notes (Unsigned)
ACUTE VISIT Chief Complaint  Patient presents with   Hot Flashes    Hot flashes and night sweats for the last week or more. Will get hot and sweat at work, will take a walk and come back and have chills. Will have night sweats and wake up wet and bed wet, sometimes in middle of night    HPI: [name] is a 46 y.o. female, who is here today complaining of vaginal dryness and hot flashes as described above. She was last seen on 12/17/21 for DUB. No associated abnormal wt loss,tremor,or changes in bowel habits.  Hx of abnormal pap smear. She has been referred to gyn x 2, referrals have been closed because their office was not able to reach her.  She just started OTC medication for menopausal symptoms, which has helped with vaginal dryness. Negative for vaginal discharge or dyspareunia.  She has had irregular menses, March and April vaginal bleeding x 2 each months and has not had any in 01/2022. LMP > 4 weeks ago. She is sexually active. Not on birth control. Lab Results  Component Value Date   WBC 3.1 (L) 12/17/2021   HGB 11.7 (L) 12/17/2021   HCT 35.6 (L) 12/17/2021   MCV 89.0 12/17/2021   PLT 169.0 12/17/2021   BP elevated today. No hx of HTN. She does not check BP at home. Negative for severe/frequent headache, visual changes, chest pain, dyspnea, palpitation, focal weakness, or edema.  Lab Results  Component Value Date   CREATININE 0.79 07/24/2021   BUN 12 07/24/2021   NA 134 (L) 07/24/2021   K 3.7 07/24/2021   CL 104 07/24/2021   CO2 24 07/24/2021   Lab Results  Component Value Date   TSH 3.66 12/17/2021   Review of Systems  Constitutional:  Negative for activity change, appetite change and fever.  HENT:  Negative for mouth sores, nosebleeds and sore throat.   Respiratory:  Negative for cough and wheezing.   Gastrointestinal:  Negative for abdominal pain, nausea and vomiting.  Endocrine: Negative for cold intolerance and heat intolerance.   Genitourinary:  Negative for decreased urine volume, dysuria and hematuria.  Neurological:  Negative for syncope, facial asymmetry and weakness.  Rest see pertinent positives and negatives per HPI.  Current Outpatient Medications on File Prior to Visit  Medication Sig Dispense Refill   diclofenac sodium (VOLTAREN) 1 % GEL Apply 2 g topically 4 (four) times daily. 100 g 0   Ferrous Sulfate (IRON PO) Take by mouth.     glucosamine-chondroitin 500-400 MG tablet Take 2 tablets by mouth daily.     Multiple Vitamins-Minerals (ZINC PO) Take by mouth.     VITAMIN A PO Take by mouth.     VITAMIN D PO Take by mouth.     VITAMIN E PO Take by mouth.     No current facility-administered medications on file prior to visit.   Past Medical History:  Diagnosis Date   GERD (gastroesophageal reflux disease)    No Known Allergies  Social History   Socioeconomic History   Marital status: Married    Spouse name: Not on file   Number of children: Not on file   Years of education: Not on file   Highest education level: Not on file  Occupational History   Not on file  Tobacco Use   Smoking status: Never   Smokeless tobacco: Never  Substance and Sexual Activity   Alcohol use: No    Alcohol/week: 0.0 standard drinks  Drug use: No   Sexual activity: Yes  Other Topics Concern   Not on file  Social History Narrative   Not on file   Social Determinants of Health   Financial Resource Strain: Not on file  Food Insecurity: Not on file  Transportation Needs: Not on file  Physical Activity: Not on file  Stress: Not on file  Social Connections: Not on file   Vitals:   02/17/22 1255 02/17/22 1322  BP: (!) 156/104 130/85  Pulse: 68   Resp: 16   Temp: 98.7 F (37.1 C)   SpO2: 98%    Body mass index is 33.08 kg/m.  Physical Exam Vitals and nursing note reviewed.  Constitutional:      General: She is not in acute distress.    Appearance: She is well-developed.  HENT:     Head:  Normocephalic and atraumatic.     Mouth/Throat:     Mouth: Mucous membranes are moist.     Pharynx: Oropharynx is clear.  Eyes:     Conjunctiva/sclera: Conjunctivae normal.  Cardiovascular:     Rate and Rhythm: Normal rate and regular rhythm.     Pulses:          Dorsalis pedis pulses are 2+ on the right side and 2+ on the left side.     Heart sounds: No murmur heard. Pulmonary:     Effort: Pulmonary effort is normal. No respiratory distress.     Breath sounds: Normal breath sounds.  Abdominal:     Palpations: Abdomen is soft. There is no hepatomegaly or mass.     Tenderness: There is no abdominal tenderness.  Genitourinary:    Comments: Deferred to gyn Lymphadenopathy:     Cervical: No cervical adenopathy.  Skin:    General: Skin is warm.     Findings: No erythema or rash.  Neurological:     General: No focal deficit present.     Mental Status: She is alert and oriented to person, place, and time.     Cranial Nerves: No cranial nerve deficit.     Gait: Gait normal.  Psychiatric:     Comments: Well groomed, good eye contact.   ASSESSMENT AND PLAN:  [name] was seen today for hot flashes.  Diagnoses and all orders for this visit: Orders Placed This Encounter  Procedures   POCT urine pregnancy   Hot flashes We discussed possible etiologies. I do not think blood work is needed today. Will plan on repeating labs during CPE in 06/2022.  Perimenopausal We discussed signs and symptoms and treatment options. OTC supplement has helped some with vaginal dryness. I recommend arranging appt with gyn and have abnormal pap smear addressed before considering treatment. A healthier diet and regular physical activity may help with symptoms.  Elevated blood pressure reading Re-checked and improved. Recommend monitoring BP at home. Wt loss will help.  Missed menses Pregnancy test negative.  Return if symptoms worsen or fail to improve.  Julie Trussell G. Martinique, MD  [facility]. [place] office.

## 2022-02-17 ENCOUNTER — Encounter: Payer: Self-pay | Admitting: Family Medicine

## 2022-02-17 ENCOUNTER — Ambulatory Visit (INDEPENDENT_AMBULATORY_CARE_PROVIDER_SITE_OTHER): Payer: Federal, State, Local not specified - PPO | Admitting: Family Medicine

## 2022-02-17 VITALS — BP 130/85 | HR 68 | Temp 98.7°F | Resp 16 | Wt 211.2 lb

## 2022-02-17 DIAGNOSIS — N951 Menopausal and female climacteric states: Secondary | ICD-10-CM

## 2022-02-17 DIAGNOSIS — R030 Elevated blood-pressure reading, without diagnosis of hypertension: Secondary | ICD-10-CM | POA: Diagnosis not present

## 2022-02-17 DIAGNOSIS — N926 Irregular menstruation, unspecified: Secondary | ICD-10-CM

## 2022-02-17 DIAGNOSIS — R232 Flushing: Secondary | ICD-10-CM

## 2022-02-17 LAB — POCT URINE PREGNANCY: Preg Test, Ur: NEGATIVE

## 2022-02-17 NOTE — Patient Instructions (Addendum)
A few things to remember from today's visit:  Hot flashes  Elevated blood pressure reading  Missed menses - Plan: POCT urine pregnancy  If you need refills please call your pharmacy. Do not use My Chart to request refills or for acute issues that need immediate attention.   Monitor blood pressure at home. Please establish with gyn, you had 2 referrals placed due to abnormal pap smear and denied because their office could not reach you. Most insurance do not need referral.  Please be sure medication list is accurate. If a new problem present, please set up appointment sooner than planned today. Perimenopause Perimenopause is the normal time of a woman's life when the levels of estrogen, the female hormone produced by the ovaries, begin to decrease. This leads to changes in menstrual periods before they stop completely (menopause). Perimenopause can begin 2-8 years before menopause. During perimenopause, the ovaries may or may not produce an egg and a woman can still become pregnant. What are the causes? This condition is caused by a natural change in hormone levels that happens as you get older. What increases the risk? This condition is more likely to start at an earlier age if you have certain medical conditions or have undergone treatments, including: A tumor of the pituitary gland in the brain. A disease that affects the ovaries and hormone production. Certain cancer treatments, such as chemotherapy or hormone therapy, or radiation therapy on the pelvis. Heavy smoking and excessive alcohol use. Family history of early menopause. What are the signs or symptoms? Perimenopausal changes affect each woman differently. Symptoms of this condition may include: Hot flashes. Irregular menstrual periods. Night sweats. Changes in feelings about sex. This could be a decrease in sex drive or an increased discomfort around your sexuality. Vaginal dryness. Headaches. Mood  swings. Depression. Problems sleeping (insomnia). Memory problems or trouble concentrating. Irritability. Tiredness. Weight gain. Anxiety. Trouble getting pregnant. How is this diagnosed? This condition is diagnosed based on your medical history, a physical exam, your age, your menstrual history, and your symptoms. Hormone tests may also be done. How is this treated? In some cases, no treatment is needed. You and your health care provider should make a decision together about whether treatment is necessary. Treatment will be based on your individual condition and preferences. Various treatments are available, such as: Menopausal hormone therapy (MHT). Medicines to treat specific symptoms. Acupuncture. Vitamin or herbal supplements. Before starting treatment, make sure to let your health care provider know if you have a personal or family history of: Heart disease. Breast cancer. Blood clots. Diabetes. Osteoporosis. Follow these instructions at home: Medicines Take over-the-counter and prescription medicines only as told by your health care provider. Take vitamin supplements only as told by your health care provider. Talk with your health care provider before starting any herbal supplements. Lifestyle  Do not use any products that contain nicotine or tobacco, such as cigarettes, e-cigarettes, and chewing tobacco. If you need help quitting, ask your health care provider. Get at least 30 minutes of physical activity on 5 or more days each week. Eat a balanced diet that includes fresh fruits and vegetables, whole grains, soybeans, eggs, lean meat, and low-fat dairy. Avoid alcoholic and caffeinated beverages, as well as spicy foods. This may help prevent hot flashes. Get 7-8 hours of sleep each night. Dress in layers that can be removed to help you manage hot flashes. Find ways to manage stress, such as deep breathing, meditation, or journaling. General instructions  Keep track  of  your menstrual periods, including: When they occur. How heavy they are and how long they last. How much time passes between periods. Keep track of your symptoms, noting when they start, how often you have them, and how long they last. Use vaginal lubricants or moisturizers to help with vaginal dryness and improve comfort during sex. You can still become pregnant if you are having irregular periods. Make sure you use contraception during perimenopause if you do not want to get pregnant. Keep all follow-up visits. This is important. This includes any group therapy or counseling. Contact a health care provider if: You have heavy vaginal bleeding or pass blood clots. Your period lasts more than 2 days longer than normal. Your periods are recurring sooner than 21 days. You bleed after having sex. You have pain during sex. Get help right away if you have: Chest pain, trouble breathing, or trouble talking. Severe depression. Pain when you urinate. Severe headaches. Vision problems. Summary Perimenopause is the time when a woman's body begins to move into menopause. This may happen naturally or as a result of other health problems or medical treatments. Perimenopause can begin 2-8 years before menopause, and it can last for several years. Perimenopausal symptoms can be managed through medicines, lifestyle changes, and complementary therapies such as acupuncture. This information is not intended to replace advice given to you by your health care provider. Make sure you discuss any questions you have with your health care provider. Document Revised: 03/01/2020 Document Reviewed: 03/01/2020 Elsevier Patient Education  [place].

## 2022-05-12 ENCOUNTER — Ambulatory Visit (INDEPENDENT_AMBULATORY_CARE_PROVIDER_SITE_OTHER): Payer: Federal, State, Local not specified - PPO | Admitting: Family Medicine

## 2022-05-12 VITALS — BP 138/98 | HR 60 | Temp 98.8°F | Wt 203.6 lb

## 2022-05-12 DIAGNOSIS — D219 Benign neoplasm of connective and other soft tissue, unspecified: Secondary | ICD-10-CM | POA: Diagnosis not present

## 2022-05-12 DIAGNOSIS — R030 Elevated blood-pressure reading, without diagnosis of hypertension: Secondary | ICD-10-CM

## 2022-05-12 DIAGNOSIS — N938 Other specified abnormal uterine and vaginal bleeding: Secondary | ICD-10-CM

## 2022-05-12 DIAGNOSIS — I10 Essential (primary) hypertension: Secondary | ICD-10-CM

## 2022-05-12 LAB — POCT URINALYSIS DIPSTICK
Bilirubin, UA: NEGATIVE
Blood, UA: NEGATIVE
Glucose, UA: NEGATIVE
Ketones, UA: NEGATIVE
Leukocytes, UA: NEGATIVE
Nitrite, UA: NEGATIVE
Protein, UA: NEGATIVE
Spec Grav, UA: 1.025 (ref 1.010–1.025)
Urobilinogen, UA: NEGATIVE E.U./dL — AB
pH, UA: 6 (ref 5.0–8.0)

## 2022-05-12 LAB — POCT URINE PREGNANCY: Preg Test, Ur: NEGATIVE

## 2022-05-12 NOTE — Patient Instructions (Signed)
Your blood pressure has been elevated during the last several visits in office.  You should consider obtaining a blood pressure cuff to monitor your blood pressure at home.  A referral was placed for you to see the gynecologist.  You should receive a phone call about scheduling this appointment.  We will have you follow-up in the next few weeks with Dr. Martinique to monitor your blood pressure.

## 2022-05-12 NOTE — Progress Notes (Signed)
Subjective:    Patient ID: Julie Love, female    DOB: December 09, 1975, 46 y.o.   MRN: [id number]  Chief Complaint  Patient presents with   Menstrual Problem    No cycle in June, july was one day, august started on the 6th for a day was black, then it turned red. Woke up this morning and no spotting, no cramps.     HPI Patient is a 46 year old female followed by Dr. Martinique who was seen today as acute visit for ongoing concern.  Patient endorses irregular menses.  No menses in June.  Had bleeding x1 day in July.  States bleeding started August 6 x 10 days.  Pt endorses "black discharge" and spotting.  Changing tampons q hr and wears 1 pad all day.  Pt denies dizziness or fatigue.  Previously seen for same issue by PCP.  Referral was placed by OB/GYN, however pt was unable to schedule appt.  Past Medical History:  Diagnosis Date   GERD (gastroesophageal reflux disease)     No Known Allergies  ROS General: Denies fever, chills, night sweats, changes in weight, changes in appetite HEENT: Denies headaches, ear pain, changes in vision, rhinorrhea, sore throat CV: Denies CP, palpitations, SOB, orthopnea Pulm: Denies SOB, cough, wheezing GI: Denies abdominal pain, nausea, vomiting, diarrhea, constipation GU: Denies dysuria, hematuria, frequency, vaginal discharge  +irregular menses Msk: Denies muscle cramps, joint pains Neuro: Denies weakness, numbness, tingling Skin: Denies rashes, bruising Psych: Denies depression, anxiety, hallucinations     Objective:    Blood pressure (!) 138/98, pulse 60, temperature 98.8 F (37.1 C), temperature source Oral, weight 203 lb 9.6 oz (92.4 kg), SpO2 99 %.  Gen. Pleasant, well-nourished, in no distress, normal affect   HEENT: [place]/AT, face symmetric, conjunctiva clear, no scleral icterus, PERRLA, EOMI, nares patent without drainage Lungs: no accessory muscle use, CTAB, no wheezes or rales Cardiovascular: RRR, no m/r/g, no peripheral edema Abdomen: BS  present, soft, NT/ND, no hepatosplenomegaly.  Round, small, palpable firm mass in lower abd. Musculoskeletal: No deformities, no cyanosis or clubbing, normal tone Neuro:  A&Ox3, CN II-XII intact, normal gait Skin:  Warm, no lesions/ rash   Wt Readings from Last 3 Encounters:  05/12/22 203 lb 9.6 oz (92.4 kg)  02/17/22 211 lb 3.2 oz (95.8 kg)  12/17/21 198 lb 6 oz (90 kg)   BP Readings from Last 3 Encounters:  05/12/22 (!) 138/98  02/17/22 130/85  12/17/21 124/80    Lab Results  Component Value Date   WBC 3.1 (L) 12/17/2021   HGB 11.7 (L) 12/17/2021   HCT 35.6 (L) 12/17/2021   PLT 169.0 12/17/2021   GLUCOSE 79 07/24/2021   CHOL 128 07/24/2021   TRIG 43.0 07/24/2021   HDL 40.20 07/24/2021   LDLCALC 79 07/24/2021   NA 134 (L) 07/24/2021   K 3.7 07/24/2021   CL 104 07/24/2021   CREATININE 0.79 07/24/2021   BUN 12 07/24/2021   CO2 24 07/24/2021   TSH 3.66 12/17/2021   HGBA1C 5.3 07/24/2021    Assessment/Plan:  DUB (dysfunctional uterine bleeding) -discussed possible causes including fibroids, polyp, thyroid dysfunction, hormone imbalance, etc.  Also consider malignancy.   -last pap 12/17/21 -pt encouraged to schedule and keep appt with OB/Gyn -iron supplement.  - Plan: CBC with Differential/Platelet, TSH, T4, Free, Iron, TIBC and Ferritin Panel, Ambulatory referral to Obstetrics / Gynecology, POCT urine pregnancy, POCT urinalysis dipstick  Fibroid  - Plan: Ambulatory referral to Obstetrics / Gynecology  Elevated blood pressure reading  in office without diagnosis of hypertension -bp elevated -lifestyle modifications -encouraged to obtain bp cuff for home monitoring. - Plan: TSH, T4, Free, Basic metabolic panel  F/u in the 3-4 wks with pcp for bp.  Grier Mitts, MD

## 2022-05-13 LAB — CBC WITH DIFFERENTIAL/PLATELET
Basophils Absolute: 0 10*3/uL (ref 0.0–0.1)
Basophils Relative: 0.7 % (ref 0.0–3.0)
Eosinophils Absolute: 0 10*3/uL (ref 0.0–0.7)
Eosinophils Relative: 1.1 % (ref 0.0–5.0)
HCT: 32.8 % — ABNORMAL LOW (ref 36.0–46.0)
Hemoglobin: 10.8 g/dL — ABNORMAL LOW (ref 12.0–15.0)
Lymphocytes Relative: 28.4 % (ref 12.0–46.0)
Lymphs Abs: 0.7 10*3/uL (ref 0.7–4.0)
MCHC: 33.1 g/dL (ref 30.0–36.0)
MCV: 90 fl (ref 78.0–100.0)
Monocytes Absolute: 0.4 10*3/uL (ref 0.1–1.0)
Monocytes Relative: 16.9 % — ABNORMAL HIGH (ref 3.0–12.0)
Neutro Abs: 1.3 10*3/uL — ABNORMAL LOW (ref 1.4–7.7)
Neutrophils Relative %: 52.9 % (ref 43.0–77.0)
Platelets: 161 10*3/uL (ref 150.0–400.0)
RBC: 3.64 Mil/uL — ABNORMAL LOW (ref 3.87–5.11)
RDW: 12.7 % (ref 11.5–15.5)
WBC: 2.5 10*3/uL — ABNORMAL LOW (ref 4.0–10.5)

## 2022-05-13 LAB — BASIC METABOLIC PANEL
BUN: 12 mg/dL (ref 6–23)
CO2: 24 mEq/L (ref 19–32)
Calcium: 8.5 mg/dL (ref 8.4–10.5)
Chloride: 104 mEq/L (ref 96–112)
Creatinine, Ser: 0.77 mg/dL (ref 0.40–1.20)
GFR: 93.01 mL/min (ref >60.00)
Glucose, Bld: 77 mg/dL (ref 70–99)
Potassium: 3.8 mEq/L (ref 3.5–5.1)
Sodium: 133 mEq/L — ABNORMAL LOW (ref 135–145)

## 2022-05-13 LAB — IRON,TIBC AND FERRITIN PANEL
%SAT: 22 % (calc) (ref 16–45)
Ferritin: 42 ng/mL (ref 16–232)
Iron: 56 ug/dL (ref 40–190)
TIBC: 252 mcg/dL (calc) (ref 250–450)

## 2022-05-13 LAB — TSH: TSH: 3.07 u[IU]/mL (ref 0.35–5.50)

## 2022-05-13 LAB — T4, FREE: Free T4: 1.01 ng/dL (ref 0.60–1.60)

## 2022-05-16 ENCOUNTER — Telehealth: Payer: Self-pay | Admitting: Family Medicine

## 2022-05-16 NOTE — Telephone Encounter (Signed)
Pt is calling and would like taylasia to call her back she has some questions lab results

## 2022-05-20 ENCOUNTER — Other Ambulatory Visit: Payer: Self-pay

## 2022-05-20 MED ORDER — IRON 325 (65 FE) MG PO TABS: 325.0000 mg | ORAL_TABLET | Freq: Every day | ORAL | 2 refills | Status: AC

## 2022-05-20 NOTE — Telephone Encounter (Signed)
Returned call to pt, no answer. Left vm to call office.

## 2022-05-27 ENCOUNTER — Encounter: Payer: Self-pay | Admitting: Family Medicine

## 2022-06-06 NOTE — Progress Notes (Deleted)
    HPI:  [name] is a 46 y.o. female, who is here today to follow on recent visit.  Review of Systems Rest see pertinent positives and negatives per HPI.  Current Outpatient Medications on File Prior to Visit  Medication Sig Dispense Refill   diclofenac sodium (VOLTAREN) 1 % GEL Apply 2 g topically 4 (four) times daily. 100 g 0   Ferrous Sulfate (IRON) 325 (65 Fe) MG TABS Take 1 tablet (325 mg total) by mouth daily. Take by mouth. 30 tablet 2   glucosamine-chondroitin 500-400 MG tablet Take 2 tablets by mouth daily.     Multiple Vitamins-Minerals (ZINC PO) Take by mouth.     VITAMIN A PO Take by mouth.     VITAMIN D PO Take by mouth.     VITAMIN E PO Take by mouth.     No current facility-administered medications on file prior to visit.    Past Medical History:  Diagnosis Date   GERD (gastroesophageal reflux disease)    No Known Allergies  Social History   Socioeconomic History   Marital status: Married    Spouse name: Not on file   Number of children: Not on file   Years of education: Not on file   Highest education level: Not on file  Occupational History   Not on file  Tobacco Use   Smoking status: Never   Smokeless tobacco: Never  Substance and Sexual Activity   Alcohol use: No    Alcohol/week: 0.0 standard drinks of alcohol   Drug use: No   Sexual activity: Yes  Other Topics Concern   Not on file  Social History Narrative   Not on file   Social Determinants of Health   Financial Resource Strain: Not on file  Food Insecurity: Not on file  Transportation Needs: Not on file  Physical Activity: Not on file  Stress: Not on file  Social Connections: Not on file    There were no vitals filed for this visit. There is no height or weight on file to calculate BMI.  Physical Exam  ASSESSMENT AND PLAN:   There are no diagnoses linked to this encounter.  No orders of the defined types were placed in this encounter.   No problem-specific  Assessment & Plan notes found for this encounter.   No follow-ups on file.   Betty G. Martinique, MD  [facility]. [place] office.

## 2022-06-09 ENCOUNTER — Ambulatory Visit: Payer: Federal, State, Local not specified - PPO | Admitting: Family Medicine

## 2022-06-13 NOTE — Progress Notes (Deleted)
    HPI:  [name] is a 46 y.o. female, who is here today to follow on recent visit.  Review of Systems Rest see pertinent positives and negatives per HPI.  Current Outpatient Medications on File Prior to Visit  Medication Sig Dispense Refill   diclofenac sodium (VOLTAREN) 1 % GEL Apply 2 g topically 4 (four) times daily. 100 g 0   Ferrous Sulfate (IRON) 325 (65 Fe) MG TABS Take 1 tablet (325 mg total) by mouth daily. Take by mouth. 30 tablet 2   glucosamine-chondroitin 500-400 MG tablet Take 2 tablets by mouth daily.     Multiple Vitamins-Minerals (ZINC PO) Take by mouth.     VITAMIN A PO Take by mouth.     VITAMIN D PO Take by mouth.     VITAMIN E PO Take by mouth.     No current facility-administered medications on file prior to visit.    Past Medical History:  Diagnosis Date   GERD (gastroesophageal reflux disease)    No Known Allergies  Social History   Socioeconomic History   Marital status: Married    Spouse name: Not on file   Number of children: Not on file   Years of education: Not on file   Highest education level: Not on file  Occupational History   Not on file  Tobacco Use   Smoking status: Never   Smokeless tobacco: Never  Substance and Sexual Activity   Alcohol use: No    Alcohol/week: 0.0 standard drinks of alcohol   Drug use: No   Sexual activity: Yes  Other Topics Concern   Not on file  Social History Narrative   Not on file   Social Determinants of Health   Financial Resource Strain: Not on file  Food Insecurity: Not on file  Transportation Needs: Not on file  Physical Activity: Not on file  Stress: Not on file  Social Connections: Not on file    There were no vitals filed for this visit. There is no height or weight on file to calculate BMI.  Physical Exam  ASSESSMENT AND PLAN:   There are no diagnoses linked to this encounter.  No orders of the defined types were placed in this encounter.   No problem-specific  Assessment & Plan notes found for this encounter.   No follow-ups on file.   Betty G. Martinique, MD  [facility]. [place] office.

## 2022-06-16 ENCOUNTER — Ambulatory Visit: Payer: Federal, State, Local not specified - PPO | Admitting: Family Medicine

## 2022-06-17 NOTE — Progress Notes (Signed)
HPI: Julie Love is a 46 y.o. female, who is here today to follow on recent visit. She was evaluated in our office on 05/12/22 for DUB and elevated BP. Home BP readings 118/80 and 120/75. Negative for severe/frequent headache, visual changes, chest pain, dyspnea, palpitation, focal weakness, or edema.  Lab Results  Component Value Date   TSH 3.07 05/12/2022   Lab Results  Component Value Date   CREATININE 0.77 05/12/2022   BUN 12 05/12/2022   NA 133 (L) 05/12/2022   K 3.8 05/12/2022   CL 104 05/12/2022   CO2 24 05/12/2022   Iron def anemia: She is on Ferrous sulfate 325 mg daily, tolerating well with no side effects. Having irregular menses. + Hot flashes and sweats. She is not on birth control.  Lab Results  Component Value Date   WBC 2.5 (L) 05/12/2022   HGB 10.8 (L) 05/12/2022   HCT 32.8 (L) 05/12/2022   MCV 90.0 05/12/2022   PLT 161.0 05/12/2022   Review of Systems  Constitutional:  Positive for fatigue. Negative for activity change, appetite change and fever.  HENT:  Negative for mouth sores, nosebleeds and trouble swallowing.   Respiratory:  Negative for cough and wheezing.   Gastrointestinal:  Negative for abdominal pain, nausea and vomiting.       Negative for changes in bowel habits.  Endocrine: Negative for cold intolerance and heat intolerance.  Genitourinary:  Negative for decreased urine volume, dysuria and hematuria.  Neurological:  Negative for syncope and facial asymmetry.  Rest see pertinent positives and negatives per HPI.  Current Outpatient Medications on File Prior to Visit  Medication Sig Dispense Refill   diclofenac sodium (VOLTAREN) 1 % GEL Apply 2 g topically 4 (four) times daily. 100 g 0   Ferrous Sulfate (IRON) 325 (65 Fe) MG TABS Take 1 tablet (325 mg total) by mouth daily. Take by mouth. 30 tablet 2   glucosamine-chondroitin 500-400 MG tablet Take 2 tablets by mouth daily.     Multiple Vitamins-Minerals (ZINC PO) Take by mouth.      VITAMIN A PO Take by mouth.     VITAMIN D PO Take by mouth.     VITAMIN E PO Take by mouth.     No current facility-administered medications on file prior to visit.   Past Medical History:  Diagnosis Date   GERD (gastroesophageal reflux disease)    No Known Allergies  Social History   Socioeconomic History   Marital status: Married    Spouse name: Not on file   Number of children: Not on file   Years of education: Not on file   Highest education level: Not on file  Occupational History   Not on file  Tobacco Use   Smoking status: Never   Smokeless tobacco: Never  Substance and Sexual Activity   Alcohol use: No    Alcohol/week: 0.0 standard drinks of alcohol   Drug use: No   Sexual activity: Yes  Other Topics Concern   Not on file  Social History Narrative   Not on file   Social Determinants of Health   Financial Resource Strain: Not on file  Food Insecurity: Not on file  Transportation Needs: Not on file  Physical Activity: Not on file  Stress: Not on file  Social Connections: Not on file   Vitals:   06/18/22 0931  BP: 110/80  Pulse: 82  Resp: 12  SpO2: 98%   Body mass index is 32.26 kg/m.  Physical  Exam Vitals and nursing note reviewed.  Constitutional:      General: She is not in acute distress.    Appearance: She is well-developed and well-groomed.  HENT:     Head: Normocephalic and atraumatic.  Eyes:     Conjunctiva/sclera: Conjunctivae normal.  Neck:     Thyroid: No thyroid mass.  Cardiovascular:     Rate and Rhythm: Normal rate and regular rhythm.     Heart sounds: No murmur heard. Pulmonary:     Effort: Pulmonary effort is normal. No respiratory distress.     Breath sounds: Normal breath sounds.  Abdominal:     Palpations: Abdomen is soft. There is no hepatomegaly or mass.     Tenderness: There is no abdominal tenderness.  Lymphadenopathy:     Cervical: No cervical adenopathy.  Skin:    General: Skin is warm.     Findings: No  erythema or rash.  Neurological:     General: No focal deficit present.     Mental Status: She is alert and oriented to person, place, and time.     Cranial Nerves: No cranial nerve deficit.     Gait: Gait normal.  Psychiatric:        Mood and Affect: Mood and affect normal.   ASSESSMENT AND PLAN:  [name] was seen today for follow-up.  Diagnoses and all orders for this visit: Orders Placed This Encounter  Procedures   Flu Vaccine QUAD 29moIM (Fluarix, Fluzone & Alfiuria Quad PF)   CBC with Differential/Platelet   Ambulatory referral to Gastroenterology   Lab Results  Component Value Date   WBC 3.1 (L) 06/18/2022   HGB 11.1 (L) 06/18/2022   HCT 33.6 (L) 06/18/2022   MCV 89.6 06/18/2022   PLT 155.0 06/18/2022   DUB (dysfunctional uterine bleeding) Possible causes discussed. ?Perimenopause. We discussed treatment options, she is not interested in hormonal therapy or OCP's. Recommend arranging appt with gyn.  Iron deficiency anemia Most likely caused by DUB, other possible etiologist discussed. Continue Fe sulfate 325 mg daily. Further recommendations according to CBC. Colonoscopy will be arranged.  Need for influenza vaccination -     Flu Vaccine QUAD 681moM (Fluarix, Fluzone & Alfiuria Quad PF)  Colon cancer screening -     Ambulatory referral to Gastroenterology Return if symptoms worsen or fail to improve.   Mardell Suttles G. JoMartiniqueMD  [facility].

## 2022-06-18 ENCOUNTER — Ambulatory Visit (INDEPENDENT_AMBULATORY_CARE_PROVIDER_SITE_OTHER): Payer: Federal, State, Local not specified - PPO | Admitting: Family Medicine

## 2022-06-18 ENCOUNTER — Encounter: Payer: Self-pay | Admitting: Family Medicine

## 2022-06-18 VITALS — BP 110/80 | HR 82 | Resp 12 | Ht 67.0 in | Wt 206.0 lb

## 2022-06-18 DIAGNOSIS — D509 Iron deficiency anemia, unspecified: Secondary | ICD-10-CM | POA: Insufficient documentation

## 2022-06-18 DIAGNOSIS — Z1211 Encounter for screening for malignant neoplasm of colon: Secondary | ICD-10-CM | POA: Diagnosis not present

## 2022-06-18 DIAGNOSIS — Z23 Encounter for immunization: Secondary | ICD-10-CM

## 2022-06-18 DIAGNOSIS — N938 Other specified abnormal uterine and vaginal bleeding: Secondary | ICD-10-CM | POA: Diagnosis not present

## 2022-06-18 LAB — CBC WITH DIFFERENTIAL/PLATELET
Basophils Absolute: 0 10*3/uL (ref 0.0–0.1)
Basophils Relative: 0.2 % (ref 0.0–3.0)
Eosinophils Absolute: 0 10*3/uL (ref 0.0–0.7)
Eosinophils Relative: 0.6 % (ref 0.0–5.0)
HCT: 33.6 % — ABNORMAL LOW (ref 36.0–46.0)
Hemoglobin: 11.1 g/dL — ABNORMAL LOW (ref 12.0–15.0)
Lymphocytes Relative: 30.5 % (ref 12.0–46.0)
Lymphs Abs: 0.9 10*3/uL (ref 0.7–4.0)
MCHC: 33 g/dL (ref 30.0–36.0)
MCV: 89.6 fl (ref 78.0–100.0)
Monocytes Absolute: 0.6 10*3/uL (ref 0.1–1.0)
Monocytes Relative: 18.1 % — ABNORMAL HIGH (ref 3.0–12.0)
Neutro Abs: 1.5 10*3/uL (ref 1.4–7.7)
Neutrophils Relative %: 50.6 % (ref 43.0–77.0)
Platelets: 155 10*3/uL (ref 150.0–400.0)
RBC: 3.74 Mil/uL — ABNORMAL LOW (ref 3.87–5.11)
RDW: 12.8 % (ref 11.5–15.5)
WBC: 3.1 10*3/uL — ABNORMAL LOW (ref 4.0–10.5)

## 2022-06-18 NOTE — Assessment & Plan Note (Addendum)
Most likely caused by DUB, other possible etiologist discussed. Continue Fe sulfate 325 mg daily. Further recommendations according to CBC. Colonoscopy will be arranged.

## 2022-06-18 NOTE — Patient Instructions (Addendum)
A few things to remember from today's visit:  Iron deficiency anemia, unspecified iron deficiency anemia type - Plan: CBC with Differential/Platelet  Colon cancer screening - Plan: Ambulatory referral to Gastroenterology  DUB (dysfunctional uterine bleeding)  Perimenopause Perimenopause is the normal time of a woman's life when the levels of estrogen, the female hormone produced by the ovaries, begin to decrease. This leads to changes in menstrual periods before they stop completely (menopause). Perimenopause can begin 2-8 years before menopause. During perimenopause, the ovaries may or may not produce an egg and a woman can still become pregnant. What are the causes? This condition is caused by a natural change in hormone levels that happens as you get older. What increases the risk? This condition is more likely to start at an earlier age if you have certain medical conditions or have undergone treatments, including: A tumor of the pituitary gland in the brain. A disease that affects the ovaries and hormone production. Certain cancer treatments, such as chemotherapy or hormone therapy, or radiation therapy on the pelvis. Heavy smoking and excessive alcohol use. Family history of early menopause. What are the signs or symptoms? Perimenopausal changes affect each woman differently. Symptoms of this condition may include: Hot flashes. Irregular menstrual periods. Night sweats. Changes in feelings about sex. This could be a decrease in sex drive or an increased discomfort around your sexuality. Vaginal dryness. Headaches. Mood swings. Depression. Problems sleeping (insomnia). Memory problems or trouble concentrating. Irritability. Tiredness. Weight gain. Anxiety. Trouble getting pregnant. How is this diagnosed? This condition is diagnosed based on your medical history, a physical exam, your age, your menstrual history, and your symptoms. Hormone tests may also be done. How is this  treated? In some cases, no treatment is needed. You and your health care provider should make a decision together about whether treatment is necessary. Treatment will be based on your individual condition and preferences. Various treatments are available, such as: Menopausal hormone therapy (MHT). Medicines to treat specific symptoms. Acupuncture. Vitamin or herbal supplements. Before starting treatment, make sure to let your health care provider know if you have a personal or family history of: Heart disease. Breast cancer. Blood clots. Diabetes. Osteoporosis. Follow these instructions at home: Medicines Take over-the-counter and prescription medicines only as told by your health care provider. Take vitamin supplements only as told by your health care provider. Talk with your health care provider before starting any herbal supplements. Lifestyle  Do not use any products that contain nicotine or tobacco, such as cigarettes, e-cigarettes, and chewing tobacco. If you need help quitting, ask your health care provider. Get at least 30 minutes of physical activity on 5 or more days each week. Eat a balanced diet that includes fresh fruits and vegetables, whole grains, soybeans, eggs, lean meat, and low-fat dairy. Avoid alcoholic and caffeinated beverages, as well as spicy foods. This may help prevent hot flashes. Get 7-8 hours of sleep each night. Dress in layers that can be removed to help you manage hot flashes. Find ways to manage stress, such as deep breathing, meditation, or journaling. General instructions  Keep track of your menstrual periods, including: When they occur. How heavy they are and how long they last. How much time passes between periods. Keep track of your symptoms, noting when they start, how often you have them, and how long they last. Use vaginal lubricants or moisturizers to help with vaginal dryness and improve comfort during sex. You can still become pregnant if  you are having irregular  periods. Make sure you use contraception during perimenopause if you do not want to get pregnant. Keep all follow-up visits. This is important. This includes any group therapy or counseling. Contact a health care provider if: You have heavy vaginal bleeding or pass blood clots. Your period lasts more than 2 days longer than normal. Your periods are recurring sooner than 21 days. You bleed after having sex. You have pain during sex. Get help right away if you have: Chest pain, trouble breathing, or trouble talking. Severe depression. Pain when you urinate. Severe headaches. Vision problems. Summary Perimenopause is the time when a woman's body begins to move into menopause. This may happen naturally or as a result of other health problems or medical treatments. Perimenopause can begin 2-8 years before menopause, and it can last for several years. Perimenopausal symptoms can be managed through medicines, lifestyle changes, and complementary therapies such as acupuncture. This information is not intended to replace advice given to you by your health care provider. Make sure you discuss any questions you have with your health care provider. Document Revised: 03/01/2020 Document Reviewed: 03/01/2020 Elsevier Patient Education  [employer].  If you need refills for medications you take chronically, please call your pharmacy. Do not use My Chart to request refills or for acute issues that need immediate attention. If you send a my chart message, it may take a few days to be addressed, specially if I am not in the office.  Please be sure medication list is accurate. If a new problem present, please set up appointment sooner than planned today.

## 2022-09-03 ENCOUNTER — Telehealth: Payer: Self-pay | Admitting: *Deleted

## 2022-09-03 NOTE — Telephone Encounter (Signed)
Pt NOS for PV.  Attempted to reach pt and it went straight to voicemail.  LMOM to call back before 5:00 today or procedure will need to be cancelled.

## 2022-09-03 NOTE — Telephone Encounter (Signed)
Pt didn't call back to reschedule PV.  Colonoscopy for 09-12-22 cancelled and No Show letter sent

## 2022-09-12 ENCOUNTER — Encounter: Payer: Federal, State, Local not specified - PPO | Admitting: Internal Medicine

## 2022-09-30 NOTE — Progress Notes (Deleted)
   ACUTE VISIT No chief complaint on file.  HPI: [name] is a 47 y.o. female, who is here today complaining of *** HPI  Review of Systems See other pertinent positives and negatives in HPI.  Current Outpatient Medications on File Prior to Visit  Medication Sig Dispense Refill   diclofenac sodium (VOLTAREN) 1 % GEL Apply 2 g topically 4 (four) times daily. 100 g 0   Ferrous Sulfate (IRON) 325 (65 Fe) MG TABS Take 1 tablet (325 mg total) by mouth daily. Take by mouth. 30 tablet 2   glucosamine-chondroitin 500-400 MG tablet Take 2 tablets by mouth daily.     Multiple Vitamins-Minerals (ZINC PO) Take by mouth.     VITAMIN A PO Take by mouth.     VITAMIN D PO Take by mouth.     VITAMIN E PO Take by mouth.     No current facility-administered medications on file prior to visit.    Past Medical History:  Diagnosis Date   GERD (gastroesophageal reflux disease)    No Known Allergies  Social History   Socioeconomic History   Marital status: Married    Spouse name: Not on file   Number of children: Not on file   Years of education: Not on file   Highest education level: Not on file  Occupational History   Not on file  Tobacco Use   Smoking status: Never   Smokeless tobacco: Never  Substance and Sexual Activity   Alcohol use: No    Alcohol/week: 0.0 standard drinks of alcohol   Drug use: No   Sexual activity: Yes  Other Topics Concern   Not on file  Social History Narrative   Not on file   Social Determinants of Health   Financial Resource Strain: Not on file  Food Insecurity: Not on file  Transportation Needs: Not on file  Physical Activity: Not on file  Stress: Not on file  Social Connections: Not on file    There were no vitals filed for this visit. There is no height or weight on file to calculate BMI.  Physical Exam  ASSESSMENT AND PLAN: There are no diagnoses linked to this encounter.  No follow-ups on file.  Betty G. Martinique, MD  [facility]. [place] office.  Discharge Instructions   None

## 2022-10-01 ENCOUNTER — Ambulatory Visit: Payer: Federal, State, Local not specified - PPO | Admitting: Family Medicine

## 2022-10-03 NOTE — Progress Notes (Signed)
Rescheduled

## 2022-10-06 ENCOUNTER — Encounter: Payer: Self-pay | Admitting: Family Medicine

## 2022-10-06 ENCOUNTER — Encounter: Payer: Federal, State, Local not specified - PPO | Admitting: Family Medicine

## 2022-10-06 NOTE — Progress Notes (Unsigned)
   ACUTE VISIT No chief complaint on file.  HPI: Julie Love is a 47 y.o. female, who is here today complaining of *** HPI  Review of Systems See other pertinent positives and negatives in HPI.  Current Outpatient Medications on File Prior to Visit  Medication Sig Dispense Refill   diclofenac sodium (VOLTAREN) 1 % GEL Apply 2 g topically 4 (four) times daily. 100 g 0   Ferrous Sulfate (IRON) 325 (65 Fe) MG TABS Take 1 tablet (325 mg total) by mouth daily. Take by mouth. 30 tablet 2   glucosamine-chondroitin 500-400 MG tablet Take 2 tablets by mouth daily.     Multiple Vitamins-Minerals (ZINC PO) Take by mouth.     VITAMIN A PO Take by mouth.     VITAMIN D PO Take by mouth.     VITAMIN E PO Take by mouth.     No current facility-administered medications on file prior to visit.    Past Medical History:  Diagnosis Date   GERD (gastroesophageal reflux disease)    No Known Allergies  Social History   Socioeconomic History   Marital status: Married    Spouse name: Not on file   Number of children: Not on file   Years of education: Not on file   Highest education level: Not on file  Occupational History   Not on file  Tobacco Use   Smoking status: Never   Smokeless tobacco: Never  Substance and Sexual Activity   Alcohol use: No    Alcohol/week: 0.0 standard drinks of alcohol   Drug use: No   Sexual activity: Yes  Other Topics Concern   Not on file  Social History Narrative   Not on file   Social Determinants of Health   Financial Resource Strain: Not on file  Food Insecurity: Not on file  Transportation Needs: Not on file  Physical Activity: Not on file  Stress: Not on file  Social Connections: Not on file    There were no vitals filed for this visit. There is no height or weight on file to calculate BMI.  Physical Exam  ASSESSMENT AND PLAN: There are no diagnoses linked to this encounter.  No follow-ups on file.  Julie Love G. Martinique, MD  [facility]. [place] office.  Discharge Instructions   None

## 2022-10-07 ENCOUNTER — Ambulatory Visit: Payer: Federal, State, Local not specified - PPO | Admitting: Family Medicine

## 2022-10-07 ENCOUNTER — Encounter: Payer: Self-pay | Admitting: Family Medicine

## 2022-10-07 VITALS — BP 120/82 | HR 60 | Temp 98.8°F | Resp 12 | Ht 67.0 in | Wt 210.0 lb

## 2022-10-07 DIAGNOSIS — R030 Elevated blood-pressure reading, without diagnosis of hypertension: Secondary | ICD-10-CM | POA: Insufficient documentation

## 2022-10-07 DIAGNOSIS — D509 Iron deficiency anemia, unspecified: Secondary | ICD-10-CM | POA: Diagnosis not present

## 2022-10-07 DIAGNOSIS — R55 Syncope and collapse: Secondary | ICD-10-CM

## 2022-10-07 LAB — BASIC METABOLIC PANEL
BUN: 15 mg/dL (ref 6–23)
CO2: 26 mEq/L (ref 19–32)
Calcium: 8.9 mg/dL (ref 8.4–10.5)
Chloride: 105 mEq/L (ref 96–112)
Creatinine, Ser: 0.86 mg/dL (ref 0.40–1.20)
GFR: 81.23 mL/min (ref >60.00)
Glucose, Bld: 80 mg/dL (ref 70–99)
Potassium: 4.2 mEq/L (ref 3.5–5.1)
Sodium: 138 mEq/L (ref 135–145)

## 2022-10-07 LAB — CBC
HCT: 35.7 % — ABNORMAL LOW (ref 36.0–46.0)
Hemoglobin: 11.7 g/dL — ABNORMAL LOW (ref 12.0–15.0)
MCHC: 32.7 g/dL (ref 30.0–36.0)
MCV: 90.6 fl (ref 78.0–100.0)
Platelets: 181 10*3/uL (ref 150.0–400.0)
RBC: 3.94 Mil/uL (ref 3.87–5.11)
RDW: 13 % (ref 11.5–15.5)
WBC: 3 10*3/uL — ABNORMAL LOW (ref 4.0–10.5)

## 2022-10-07 NOTE — Assessment & Plan Note (Signed)
We discussed possible etiologies, history and examination today do not suggest a serious process. Changes in her diet, hydration, and long work hours around bedtime event happened could have been contributing factors. CBC and BMP ordered today, I do not think further testing is needed at this time. Instructed about warning signs.

## 2022-10-07 NOTE — Patient Instructions (Signed)
A few things to remember from today's visit:  Elevated blood pressure reading - Plan: Basic metabolic panel  Iron deficiency anemia, unspecified iron deficiency anemia type - Plan: CBC  Pre-syncope - Plan: Basic metabolic panel, CBC How to Take Your Blood Pressure Blood pressure is a measurement of how strongly your blood is pressing against the walls of your arteries. Arteries are blood vessels that carry blood from your heart throughout your body. Your health care provider takes your blood pressure at each office visit. You can also take your own blood pressure at home with a blood pressure monitor. You may need to take your own blood pressure to: Confirm a diagnosis of high blood pressure (hypertension). Monitor your blood pressure over time. Make sure your blood pressure medicine is working. Supplies needed: Blood pressure monitor. A chair to sit in. This should be a chair where you can sit upright with your back supported. Do not sit on a soft couch or an armchair. Table or desk. Small notebook and pencil or pen. How to prepare To get the most accurate reading, avoid the following for 30 minutes before you check your blood pressure: Drinking caffeine. Drinking alcohol. Eating. Smoking. Exercising. Five minutes before you check your blood pressure: Use the bathroom and urinate so that you have an empty bladder. Sit quietly in a chair. Do not talk. How to take your blood pressure To check your blood pressure, follow the instructions in the manual that came with your blood pressure monitor. If you have a digital blood pressure monitor, the instructions may be as follows: Sit up straight in a chair. Place your feet on the floor. Do not cross your ankles or legs. Rest your left arm at the level of your heart on a table or desk or on the arm of a chair. Pull up your shirt sleeve. Wrap the blood pressure cuff around the upper part of your left arm, 1 inch (2.5 cm) above your elbow. It  is best to wrap the cuff around bare skin. Fit the cuff snugly, but not too tightly, around your arm. You should be able to place only one finger between the cuff and your arm. Position the cord so that it rests in the bend of your elbow. Press the power button. Sit quietly while the cuff inflates and deflates. Read the digital reading on the monitor screen and write the numbers down (record them) in a notebook. Wait 2-3 minutes, then repeat the steps, starting at step 1. What does my blood pressure reading mean? A blood pressure reading consists of a higher number over a lower number. Ideally, your blood pressure should be below 120/80. The first ("top") number is called the systolic pressure. It is a measure of the pressure in your arteries as your heart beats. The second ("bottom") number is called the diastolic pressure. It is a measure of the pressure in your arteries as the heart relaxes. Blood pressure is classified into four stages. The following are the stages for adults who do not have a short-term serious illness or a chronic condition. Systolic pressure and diastolic pressure are measured in a unit called mm Hg (millimeters of mercury).  Normal Systolic pressure: below 741. Diastolic pressure: below 80. Elevated Systolic pressure: 638-453. Diastolic pressure: below 80. Hypertension stage 1 Systolic pressure: 646-803. Diastolic pressure: 21-22. Hypertension stage 2 Systolic pressure: 482 or above. Diastolic pressure: 90 or above. You can have elevated blood pressure or hypertension even if only the systolic or only the  diastolic number in your reading is higher than normal. Follow these instructions at home: Medicines Take over-the-counter and prescription medicines only as told by your health care provider. Tell your health care provider if you are having any side effects from blood pressure medicine. General instructions Check your blood pressure as often as recommended by  your health care provider. Check your blood pressure at the same time every day. Take your monitor to the next appointment with your health care provider to make sure that: You are using it correctly. It provides accurate readings. Understand what your goal blood pressure numbers are. Keep all follow-up visits. This is important. General tips Your health care provider can suggest a reliable monitor that will meet your needs. There are several types of home blood pressure monitors. Choose a monitor that has an arm cuff. Do not choose a monitor that measures your blood pressure from your wrist or finger. Choose a cuff that wraps snugly, not too tight or too loose, around your upper arm. You should be able to fit only one finger between your arm and the cuff. You can buy a blood pressure monitor at most drugstores or online. Where to find more information American Heart Association: www.heart.org Contact a health care provider if: Your blood pressure is consistently high. Your blood pressure is suddenly low. Get help right away if: Your systolic blood pressure is higher than 180. Your diastolic blood pressure is higher than 120. These symptoms may be an emergency. Get help right away. Call 911. Do not wait to see if the symptoms will go away. Do not drive yourself to the hospital. Summary Blood pressure is a measurement of how strongly your blood is pressing against the walls of your arteries. A blood pressure reading consists of a higher number over a lower number. Ideally, your blood pressure should be below 120/80. Check your blood pressure at the same time every day. Avoid caffeine, alcohol, smoking, and exercise for 30 minutes prior to checking your blood pressure. These agents can affect the accuracy of the blood pressure reading. This information is not intended to replace advice given to you by your health care provider. Make sure you discuss any questions you have with your health  care provider. Document Revised: 05/30/2021 Document Reviewed: 05/30/2021 Elsevier Patient Education  [place].   Blood pressure readings in 2 weeks.  Do not use My Chart to request refills or for acute issues that need immediate attention. If you send a my chart message, it may take a few days to be addressed, specially if I am not in the office.  Please be sure medication list is accurate. If a new problem present, please set up appointment sooner than planned today.

## 2022-10-07 NOTE — Assessment & Plan Note (Addendum)
Re-checked today 145/95. We discussed Dx criteria for HTN and possible complications. Low salt diet and decreasing process food intake recommended. Instructed to monitor BP at home, appropriate technique discussed. BP goal <=130/80.

## 2022-10-07 NOTE — Assessment & Plan Note (Signed)
Problem has been otherwise stable. We discussed possible etiologies, most like related to menstrual flow.  She has not had colonoscopy done, recommend calling to schedule appointment. Continue ferrous sulfate 325 mg daily. Further recommendation will be given according to CBC results.

## 2022-12-17 ENCOUNTER — Emergency Department (HOSPITAL_COMMUNITY)
Admission: EM | Admit: 2022-12-17 | Discharge: 2022-12-17 | Disposition: A | Discharge status: Home / Self Care | Payer: Federal, State, Local not specified - PPO | Attending: Emergency Medicine | Admitting: Emergency Medicine

## 2022-12-17 ENCOUNTER — Emergency Department (HOSPITAL_COMMUNITY): Payer: Federal, State, Local not specified - PPO

## 2022-12-17 ENCOUNTER — Encounter (HOSPITAL_COMMUNITY): Payer: Self-pay | Admitting: Emergency Medicine

## 2022-12-17 ENCOUNTER — Other Ambulatory Visit: Payer: Self-pay

## 2022-12-17 DIAGNOSIS — R9431 Abnormal electrocardiogram [ECG] [EKG]: Secondary | ICD-10-CM | POA: Diagnosis not present

## 2022-12-17 DIAGNOSIS — R569 Unspecified convulsions: Secondary | ICD-10-CM | POA: Diagnosis not present

## 2022-12-17 DIAGNOSIS — R55 Syncope and collapse: Secondary | ICD-10-CM | POA: Diagnosis not present

## 2022-12-17 DIAGNOSIS — I10 Essential (primary) hypertension: Secondary | ICD-10-CM | POA: Diagnosis not present

## 2022-12-17 LAB — CBC WITH DIFFERENTIAL/PLATELET
Abs Immature Granulocytes: 0.01 10*3/uL (ref 0.00–0.07)
Basophils Absolute: 0 10*3/uL (ref 0.0–0.1)
Basophils Relative: 0 %
Eosinophils Absolute: 0 10*3/uL (ref 0.0–0.5)
Eosinophils Relative: 0 %
HCT: 34.3 % — ABNORMAL LOW (ref 36.0–46.0)
Hemoglobin: 11.2 g/dL — ABNORMAL LOW (ref 12.0–15.0)
Immature Granulocytes: 0 %
Lymphocytes Relative: 25 %
Lymphs Abs: 0.7 10*3/uL (ref 0.7–4.0)
MCH: 29.9 pg (ref 26.0–34.0)
MCHC: 32.7 g/dL (ref 30.0–36.0)
MCV: 91.5 fL (ref 80.0–100.0)
Monocytes Absolute: 0.3 10*3/uL (ref 0.1–1.0)
Monocytes Relative: 12 %
Neutro Abs: 1.6 10*3/uL — ABNORMAL LOW (ref 1.7–7.7)
Neutrophils Relative %: 63 %
Platelets: 148 10*3/uL — ABNORMAL LOW (ref 150–400)
RBC: 3.75 MIL/uL — ABNORMAL LOW (ref 3.87–5.11)
RDW: 12.2 % (ref 11.5–15.5)
WBC: 2.7 10*3/uL — ABNORMAL LOW (ref 4.0–10.5)
nRBC: 0 % (ref 0.0–0.2)

## 2022-12-17 LAB — HEPATIC FUNCTION PANEL
ALT: 16 U/L (ref 0–44)
AST: 29 U/L (ref 15–41)
Albumin: 3.2 g/dL — ABNORMAL LOW (ref 3.5–5.0)
Alkaline Phosphatase: 41 U/L (ref 38–126)
Bilirubin, Direct: <0.1 mg/dL (ref 0.0–0.2)
Total Bilirubin: 0.4 mg/dL (ref 0.3–1.2)
Total Protein: 8.1 g/dL (ref 6.5–8.1)

## 2022-12-17 LAB — RAPID URINE DRUG SCREEN, HOSP PERFORMED
Amphetamines: NOT DETECTED
Barbiturates: NOT DETECTED
Benzodiazepines: NOT DETECTED
Cocaine: NOT DETECTED
Opiates: NOT DETECTED
Tetrahydrocannabinol: POSITIVE — AB

## 2022-12-17 LAB — BASIC METABOLIC PANEL
Anion gap: 6 (ref 5–15)
BUN: 9 mg/dL (ref 6–20)
CO2: 23 mmol/L (ref 22–32)
Calcium: 8.7 mg/dL — ABNORMAL LOW (ref 8.9–10.3)
Chloride: 104 mmol/L (ref 98–111)
Creatinine, Ser: 0.76 mg/dL (ref 0.44–1.00)
GFR, Estimated: >60 mL/min (ref >60)
Glucose, Bld: 87 mg/dL (ref 70–99)
Potassium: 3.7 mmol/L (ref 3.5–5.1)
Sodium: 133 mmol/L — ABNORMAL LOW (ref 135–145)

## 2022-12-17 LAB — CBG MONITORING, ED: Glucose-Capillary: 81 mg/dL (ref 70–99)

## 2022-12-17 LAB — I-STAT BETA HCG BLOOD, ED (MC, WL, AP ONLY): I-stat hCG, quantitative: <5 m[IU]/mL (ref <5)

## 2022-12-17 LAB — ETHANOL: Alcohol, Ethyl (B): <10 mg/dL (ref <10)

## 2022-12-17 MED ORDER — LORAZEPAM 2 MG/ML IJ SOLN
INTRAMUSCULAR | Status: AC
  Filled 2022-12-17: qty 1

## 2022-12-17 MED ORDER — LEVETIRACETAM IN NACL 500 MG/100ML IV SOLN
500.0000 mg | INTRAVENOUS | Status: AC
  Administered 2022-12-17 (×3): 500 mg via INTRAVENOUS
  Filled 2022-12-17 (×3): qty 100

## 2022-12-17 MED ORDER — LEVETIRACETAM 500 MG PO TABS: 500.0000 mg | ORAL_TABLET | Freq: Two times a day (BID) | ORAL | 0 refills | Status: DC

## 2022-12-17 NOTE — ED Provider Notes (Cosign Needed Addendum)
[facility] Provider Note   CSN: [id number] Arrival date & time: 12/17/22  0154     History  Chief Complaint  Patient presents with   Loss of Consciousness    Julie Love is a 47 y.o. female.  47 year old female brought in by EMS for syncopal event at work tonight. 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, felt really tired and sat down. Patient woke up lying on the floor with her coworkers standing around her. Did not loose control of bladder, did not bite tongue, no report of seizure like activity. States she has not eaten since 1pm yesterday, unsure if related. States she had a similar episode on 09/18/22 (birthday), was at home when she felt like she was going to pass out so she sat on the floor and woke up lying on the floor, did not get seen after that episode. States she feels like she spaces out at times. No history of seizures. Smoked marijuana earlier today, no regular alcohol use.        Home Medications Prior to Admission medications   Medication Sig Start Date End Date Taking? Authorizing Provider  levETIRAcetam (KEPPRA) 500 MG tablet Take 1 tablet (500 mg total) by mouth 2 (two) times daily. 12/17/22 01/16/23 Yes Tacy Learn, PA-C  diclofenac sodium (VOLTAREN) 1 % GEL Apply 2 g topically 4 (four) times daily. 08/06/19   Khatri, Hina, PA-C  Ferrous Sulfate (IRON) 325 (65 Fe) MG TABS Take 1 tablet (325 mg total) by mouth daily. Take by mouth. 05/20/22   Billie Ruddy, MD  glucosamine-chondroitin 500-400 MG tablet Take 2 tablets by mouth daily.    [provider]  Multiple Vitamins-Minerals (ZINC PO) Take by mouth.    [provider]  VITAMIN A PO Take by mouth.    [provider]  VITAMIN D PO Take by mouth.    [provider]  VITAMIN E PO Take by mouth.    [provider]      Allergies    Patient has no known allergies.    Review of Systems    Review of Systems  Physical Exam Updated Vital Signs BP (!) 139/95 (BP Location: Right Arm)   Pulse 68   Temp 98 F (36.7 C) (Oral)   Resp 18   Ht 5\' 7"  (1.702 m)   Wt 94.3 kg   SpO2 98%   BMI 32.58 kg/m  Physical Exam Vitals and nursing note reviewed.  Constitutional:      General: She is not in acute distress.    Appearance: She is well-developed. She is not diaphoretic.  HENT:     Head: Normocephalic and atraumatic.     Mouth/Throat:     Mouth: Mucous membranes are moist.  Eyes:     Conjunctiva/sclera: Conjunctivae normal.  Cardiovascular:     Rate and Rhythm: Normal rate and regular rhythm.     Heart sounds: Normal heart sounds.  Pulmonary:     Effort: Pulmonary effort is normal.     Breath sounds: Normal breath sounds.  Abdominal:     Palpations: Abdomen is soft.     Tenderness: There is no abdominal tenderness.  Musculoskeletal:     Cervical back: Neck supple.     Right lower leg: No edema.     Left lower leg: No edema.  Skin:    General: Skin is warm and dry.  Findings: No erythema or rash.  Neurological:     Mental Status: She is alert and oriented to person, place, and time.     Cranial Nerves: No cranial nerve deficit.     Sensory: No sensory deficit.  Psychiatric:        Behavior: Behavior normal.     ED Results / Procedures / Treatments   Labs (all labs ordered are listed, but only abnormal results are displayed) Labs Reviewed  CBC WITH DIFFERENTIAL/PLATELET - Abnormal; Notable for the following components:      Result Value   WBC 2.7 (*)    RBC 3.75 (*)    Hemoglobin 11.2 (*)    HCT 34.3 (*)    Platelets 148 (*)    Neutro Abs 1.6 (*)    All other components within normal limits  BASIC METABOLIC PANEL - Abnormal; Notable for the following components:   Sodium 133 (*)    Calcium 8.7 (*)    All other components within normal limits  RAPID URINE DRUG SCREEN, HOSP PERFORMED - Abnormal; Notable for the following components:    Tetrahydrocannabinol POSITIVE (*)    All other components within normal limits  HEPATIC FUNCTION PANEL - Abnormal; Notable for the following components:   Albumin 3.2 (*)    All other components within normal limits  ETHANOL  CBG MONITORING, ED  I-STAT BETA HCG BLOOD, ED ([facility], [facility], [facility] ONLY)    EKG None  Radiology CT Head Wo Contrast  Result Date: 12/17/2022 CLINICAL DATA:  Seizure, new-onset, no history of trauma EXAM: CT HEAD WITHOUT CONTRAST TECHNIQUE: Contiguous axial images were obtained from the base of the skull through the vertex without intravenous contrast. RADIATION DOSE REDUCTION: This exam was performed according to the departmental dose-optimization program which includes automated exposure control, adjustment of the mA and/or kV according to patient size and/or use of iterative reconstruction technique. COMPARISON:  None Available. FINDINGS: Brain: Normal anatomic configuration. No abnormal intra or extra-axial mass lesion or fluid collection. No abnormal mass effect or midline shift. No evidence of acute intracranial hemorrhage or infarct. Ventricular size is normal. Cerebellum unremarkable. Vascular: Unremarkable Skull: Intact Sinuses/Orbits: There is mucosal thickening within the maxillary sinuses bilaterally as well as a small amount of layering fluid within the right sphenoid sinus. Remaining paranasal sinuses are clear. Orbits are unremarkable. Other: Mastoid air cells and middle ear cavities are clear. IMPRESSION: 1. No acute intracranial abnormality. 2. Mild paranasal sinus disease. Electronically Signed   By: Fidela Salisbury M.D.   On: 12/17/2022 03:06    Procedures Procedures    Medications Ordered in ED Medications  LORazepam (ATIVAN) 2 MG/ML injection (  Given 12/17/22 0218)  levETIRAcetam (KEPPRA) IVPB 500 mg/100 mL premix (0 mg Intravenous Stopped 12/17/22 0457)    ED Course/ Medical Decision Making/ A&P                             Medical Decision  Making Amount and/or Complexity of Data Reviewed Labs: ordered. Radiology: ordered.  Risk Prescription drug management.   This patient presents to the ED for concern of syncope vs seizure, this involves an extensive number of treatment options, and is a complaint that carries with it a high risk of complications and morbidity.  The differential diagnosis includes but not limited to syncope, arrhythmia, metabolic abnormality, hypoglycemia, seizure   Co morbidities that complicate the patient evaluation  GERD, iron deficiency anemia    Additional history  obtained:  Additional history obtained from daughter at bedside who contributes to history as above External records from outside source obtained and reviewed including prior note to PCP 10/07/22, discussed her episode from 09/18/22, felt to be related to change in diet (previously vegan x 3 years, has been eating meat again).    Lab Tests:  I Ordered, and personally interpreted labs.  The pertinent results include:  hcg negative; CMP without significant findings; CBC with WBC 2.7, gradual decline when priors reviewed.    Imaging Studies ordered:  I ordered imaging studies including CT head  I independently visualized and interpreted imaging which showed no acute abnormality  I agree with the radiologist interpretation   Cardiac Monitoring: / EKG:  The patient was maintained on a cardiac monitor.  I personally viewed and interpreted the cardiac monitored which showed an underlying rhythm of: sinus rhythm rate 64   Consultations Obtained:  I requested consultation with the neurohospitalist Dr. Leonel Ramsay,  and discussed lab and imaging findings as well as pertinent plan - they recommend: start Horace, follow up with neurology    Problem List / ED Course / Critical interventions / Medication management  46 year old female brought in by EMS from work with concern for seizure vs syncope tonight. Patient was at work, felt  suddenly exhausted and sat down, then slid out of the chair onto the floor. No reports of seizure like activity or postictal state. Arrived in the ER alert and oriented, shortly after exam, I was called back to the room for possible seizure. Patient with tonic clonic activity (arms outstretched and tense, drooling/foam, drop in O2 sats). Provided with O2 and suction awaiting Ativan which was provided IV. Episode lasting just over 30 seconds, postictal following. Reports episode in December when she was alone at home, felt like she was going to pass out so she sat down, later woke up lying on the ground with drool. Provided with Keppra with concern for second seizure. Discussed with neuro hospitalist, agrees with starting Keppra, seizure precautions and neuro OP follow up. Ambulatory referral provided tonight. Observed in the ER for 4 hours following event without further events. Patient awake and to baseline, daughter at bedside. Discussed seizure precautions, specifically no driving until cleared by neurology.  I ordered medication including ativan, Keppra  for seizure  Reevaluation of the patient after these medicines showed that the patient improved I have reviewed the patients home medicines and have made adjustments as needed   Social Determinants of Health:  Has PCP   Test / Admission - Considered:  Dc with amb referral to neuro          Final Clinical Impression(s) / ED Diagnoses Final diagnoses:  Seizure ([place])    Rx / DC Orders ED Discharge Orders          Ordered    Ambulatory referral to Neurology       Comments: An appointment is requested in approximately: 1 week   12/17/22 0431    levETIRAcetam (KEPPRA) 500 MG tablet  2 times daily        12/17/22 0508              Tacy Learn, PA-C 12/17/22 0609    Tacy Learn, PA-C 12/17/22 [id number]    Merryl Hacker, MD 12/18/22 [id number]

## 2022-12-17 NOTE — ED Triage Notes (Signed)
Patient BIB [facility] with syncopal episode/possible seizure like episode.  EMS reports patient was at work and coworkers report patient slid of her chair onto the floor and was "unresponsive" for approx 30 seconds.  EMS report patient was lethargic upon their arrival.  Patient reports feeling more tired for the past 7 months and having similar episodes to this since going on a vegan diet.  Patient also reports she might be going through menopause.   148/92 16 RR 70 HR CBG 109 100% RA

## 2022-12-17 NOTE — Discharge Instructions (Addendum)
Referral placed from the ER to [facility] tonight. You can call the office if you do not receive a call to schedule an appointment.   Do NOT drive until cleared by neurology. Do not soak in a tub or swim alone, work from a ladder or height that would cause injury if you were to fall/have a seizure.

## 2022-12-17 NOTE — ED Notes (Addendum)
Patient's daughter called staff to room.  Patient found to be having seizure like activity, lasted approx 30 seconds.  PA immediately called to bedside and verbal order given for ativan.  Given per [facility].  Patient stopped seizing, became post ictal.  Seizure pads placed on bed rails and patient placed on oxygen.  No incontinence noted.  Patient currently sleeping at this time, daughter remains at bedside.

## 2022-12-17 NOTE — ED Notes (Signed)
Patient to CT.

## 2022-12-30 ENCOUNTER — Telehealth: Payer: Self-pay | Admitting: Neurology

## 2022-12-30 ENCOUNTER — Ambulatory Visit (INDEPENDENT_AMBULATORY_CARE_PROVIDER_SITE_OTHER): Payer: Federal, State, Local not specified - PPO | Admitting: Neurology

## 2022-12-30 ENCOUNTER — Encounter: Payer: Self-pay | Admitting: Neurology

## 2022-12-30 VITALS — BP 120/73 | HR 69 | Ht 67.0 in | Wt 200.0 lb

## 2022-12-30 DIAGNOSIS — G40909 Epilepsy, unspecified, not intractable, without status epilepticus: Secondary | ICD-10-CM | POA: Diagnosis not present

## 2022-12-30 DIAGNOSIS — G40009 Localization-related (focal) (partial) idiopathic epilepsy and epileptic syndromes with seizures of localized onset, not intractable, without status epilepticus: Secondary | ICD-10-CM | POA: Diagnosis not present

## 2022-12-30 DIAGNOSIS — Z5181 Encounter for therapeutic drug level monitoring: Secondary | ICD-10-CM | POA: Diagnosis not present

## 2022-12-30 MED ORDER — LEVETIRACETAM 500 MG PO TABS: 500.0000 mg | ORAL_TABLET | Freq: Two times a day (BID) | ORAL | 3 refills | Status: DC

## 2022-12-30 NOTE — Patient Instructions (Addendum)
Continue with Keppra 500 mg twice daily, refills given  Will check Keppra level today Routine EEG MRI brain with and without contrast Discussed driving restriction for the next 6 months Follow-up in 6 months or sooner if worse

## 2022-12-30 NOTE — Telephone Encounter (Signed)
[employer] federal NPR sent to GI [phone] 

## 2022-12-30 NOTE — Progress Notes (Signed)
[facility]  PATIENT: Julie Love DOB: 07-03-1976  REQUESTING CLINICIAN: Tacy Learn, PA-C HISTORY FROM: Patient  REASON FOR VISIT: Seizure, here to establish care    HISTORICAL  CHIEF COMPLAINT:  Chief Complaint  Patient presents with   New Patient (Initial Visit)    Rm 12,Hospital referral for seizures Reports "strange feelings"  that occur at random times but no seizure events     HISTORY OF PRESENT ILLNESS:  This is a 47 year old woman with no reported past medical history who is presenting to establish care for her seizure.  Patient reports that her first event happened on December 24.  She was at work, felt funny then went to sit down and remembers waking up on the floor.  She was told by coworker that she was drooling at the mouth.  She did not go to the hospital.  She did okay until March 20 when she had another event at work.  Again she was at work, felt funny went to sit down and fell to the floor.  She was told by coworkers that she was foaming at the mouth.  EMS was called as patient was taken to the ED.  In the ED she had another event witnessed by her daughter and ED staff.  It started with patient having behavioral arrest during conversation with daughter, head turning to the left, eyes open, tonic posturing and foaming at the mouth.  Patient was given Ativan in the hospital and started on Keppra 500 mg twice daily.  Since then she has been compliant with the Keppra 500 mg twice daily, denies any side effect from the medication and so far has not had any additional events.  She reports that her son had seizures during the first 5 years of his life but has outgrew the seizure, other than that no other family members with seizures and no seizure or other seizure risk factors identified.  Handedness: Continue with Keppra 500 mg  Onset: September 21 2022  Seizure Type: Behavioral arrest, head turn to the left, foaming at the mouth and stiffness    Current frequency: Last event was March 20  Any injuries from seizures: Denies   Seizure risk factors: Son with seizure in the first 5 years  Previous ASMs: None   Currenty ASMs: Levetiracetam   ASMs side effects: Levetiracetam 500 mg twice daily   Brain Images: Normal head CT   Previous EEGs: Not previously done    OTHER MEDICAL CONDITIONS: Denies  REVIEW OF SYSTEMS: Full 14 system review of systems performed and negative with exception of: As noted in HPI  ALLERGIES: No Known Allergies  HOME MEDICATIONS: Outpatient Medications Prior to Visit  Medication Sig Dispense Refill   diclofenac sodium (VOLTAREN) 1 % GEL Apply 2 g topically 4 (four) times daily. 100 g 0   Ferrous Sulfate (IRON) 325 (65 Fe) MG TABS Take 1 tablet (325 mg total) by mouth daily. Take by mouth. 30 tablet 2   glucosamine-chondroitin 500-400 MG tablet Take 2 tablets by mouth daily.     Multiple Vitamins-Minerals (ZINC PO) Take by mouth.     VITAMIN A PO Take by mouth.     VITAMIN D PO Take by mouth.     VITAMIN E PO Take by mouth.     levETIRAcetam (KEPPRA) 500 MG tablet Take 1 tablet (500 mg total) by mouth 2 (two) times daily. 60 tablet 0   No facility-administered medications prior to visit.    PAST  MEDICAL HISTORY: Past Medical History:  Diagnosis Date   GERD (gastroesophageal reflux disease)     PAST SURGICAL HISTORY: Past Surgical History:  Procedure Laterality Date   CESAREAN SECTION     FOOT SURGERY Right    TUBAL LIGATION      FAMILY HISTORY: Family History  Problem Relation Age of Onset   Hypertension Mother    Cancer Neg Hx    Heart disease Neg Hx    Diabetes Neg Hx     SOCIAL HISTORY: Social History   Socioeconomic History   Marital status: Married    Spouse name: Not on file   Number of children: Not on file   Years of education: Not on file   Highest education level: Not on file  Occupational History   Not on file  Tobacco Use   Smoking status: Never    Smokeless tobacco: Never  Substance and Sexual Activity   Alcohol use: No    Alcohol/week: 0.0 standard drinks of alcohol   Drug use: No   Sexual activity: Yes  Other Topics Concern   Not on file  Social History Narrative   Not on file   Social Determinants of Health   Financial Resource Strain: Not on file  Food Insecurity: Not on file  Transportation Needs: Not on file  Physical Activity: Not on file  Stress: Not on file  Social Connections: Not on file  Intimate Partner Violence: Not on file    PHYSICAL EXAM  GENERAL EXAM/CONSTITUTIONAL: Vitals:  Vitals:   12/30/22 1415  BP: 120/73  Pulse: 69  Weight: 200 lb (90.7 kg)  Height: 5\' 7"  (1.702 m)   Body mass index is 31.32 kg/m. Wt Readings from Last 3 Encounters:  12/30/22 200 lb (90.7 kg)  12/17/22 208 lb (94.3 kg)  10/07/22 210 lb (95.3 kg)   Patient is in no distress; well developed, nourished and groomed; neck is supple  EYES: Visual fields full to confrontation, Extraocular movements intacts,  No results found.  MUSCULOSKELETAL: Gait, strength, tone, movements noted in Neurologic exam below  NEUROLOGIC: MENTAL STATUS:      No data to display         awake, alert, oriented to person, place and time recent and remote memory intact normal attention and concentration language fluent, comprehension intact, naming intact fund of knowledge appropriate  CRANIAL NERVE:  2nd, 3rd, 4th, 6th - Visual fields full to confrontation, extraocular muscles intact, no nystagmus 5th - facial sensation symmetric 7th - facial strength symmetric 8th - hearing intact 9th - palate elevates symmetrically, uvula midline 11th - shoulder shrug symmetric 12th - tongue protrusion midline  MOTOR:  normal bulk and tone, full strength in the BUE, BLE  SENSORY:  normal and symmetric to light touch  COORDINATION:  finger-nose-finger, fine finger movements normal  REFLEXES:  deep tendon reflexes present and  symmetric  GAIT/STATION:  normal   DIAGNOSTIC DATA (LABS, IMAGING, TESTING) - I reviewed patient records, labs, notes, testing and imaging myself where available.  Lab Results  Component Value Date   WBC 2.7 (L) 12/17/2022   HGB 11.2 (L) 12/17/2022   HCT 34.3 (L) 12/17/2022   MCV 91.5 12/17/2022   PLT 148 (L) 12/17/2022      Component Value Date/Time   NA 133 (L) 12/17/2022 0210   K 3.7 12/17/2022 0210   CL 104 12/17/2022 0210   CO2 23 12/17/2022 0210   GLUCOSE 87 12/17/2022 0210   BUN 9 12/17/2022 0210  CREATININE 0.76 12/17/2022 0210   CREATININE 0.80 04/27/2020 1301   CALCIUM 8.7 (L) 12/17/2022 0210   PROT 8.1 12/17/2022 0250   ALBUMIN 3.2 (L) 12/17/2022 0250   AST 29 12/17/2022 0250   ALT 16 12/17/2022 0250   ALKPHOS 41 12/17/2022 0250   BILITOT 0.4 12/17/2022 0250   GFRNONAA >60 12/17/2022 0210   Lab Results  Component Value Date   CHOL 128 07/24/2021   HDL 40.20 07/24/2021   LDLCALC 79 07/24/2021   TRIG 43.0 07/24/2021   Lab Results  Component Value Date   HGBA1C 5.3 07/24/2021   No results found for: "VITAMINB12" Lab Results  Component Value Date   TSH 3.07 05/12/2022    Head CT 12/17/2022 1. No acute intracranial abnormality.   I personally reviewed brain Images.   ASSESSMENT AND PLAN  47 y.o. year old female  with no reported past medical history who is presenting after her last seizure on March 20.  Patient had her first event on December 24.  Based on description of behavioral arrest, head turning and tonic posturing, patient likely had focal seizures.  She is on Keppra 500 mg twice daily, will check a level and continue patient on the [place].  I will check a routine EEG and MRI brain with and without contrast.  We also discussed driving restriction for the next 6 months and she voices understanding.  I will see her in 6 months for follow-up or sooner if worse.  All other questions answered.   1. Partial idiopathic epilepsy with seizures of  localized onset, not intractable, without status epilepticus   2. Therapeutic drug monitoring     Patient Instructions  Continue with Keppra 500 mg twice daily, refills given  Will check Keppra level today Routine EEG MRI brain with and without contrast Discussed driving restriction for the next 6 months Follow-up in 6 months or sooner if worse     Per [facility] statutes, patients with seizures are not allowed to drive until they have been seizure-free for six months.  Other recommendations include using caution when using heavy equipment or power tools. Avoid working on ladders or at heights. Take showers instead of baths.  Do not swim alone.  Ensure the water temperature is not too high on the home water heater. Do not go swimming alone. Do not lock yourself in a room alone (i.e. bathroom). When caring for infants or small children, sit down when holding, feeding, or changing them to minimize risk of injury to the child in the event you have a seizure. Maintain good sleep hygiene. Avoid alcohol.  Also recommend adequate sleep, hydration, good diet and minimize stress.   During the Seizure  - First, ensure adequate ventilation and place patients on the floor on their left side  Loosen clothing around the neck and ensure the airway is patent. If the patient is clenching the teeth, do not force the mouth open with any object as this can cause severe damage - Remove all items from the surrounding that can be hazardous. The patient may be oblivious to what's happening and may not even know what he or she is doing. If the patient is confused and wandering, either gently guide him/her away and block access to outside areas - Reassure the individual and be comforting - Call 911. In most cases, the seizure ends before EMS arrives. However, there are cases when seizures may last over 3 to 5 minutes. Or the individual may have developed breathing  difficulties or severe injuries. If a  pregnant patient or a person with diabetes develops a seizure, it is prudent to call an ambulance. - Finally, if the patient does not regain full consciousness, then call EMS. Most patients will remain confused for about 45 to 90 minutes after a seizure, so you must use judgment in calling for help. - Avoid restraints but make sure the patient is in a bed with padded side rails - Place the individual in a lateral position with the neck slightly flexed; this will help the saliva drain from the mouth and prevent the tongue from falling backward - Remove all nearby furniture and other hazards from the area - Provide verbal assurance as the individual is regaining consciousness - Provide the patient with privacy if possible - Call for help and start treatment as ordered by the caregiver   After the Seizure (Postictal Stage)  After a seizure, most patients experience confusion, fatigue, muscle pain and/or a headache. Thus, one should permit the individual to sleep. For the next few days, reassurance is essential. Being calm and helping reorient the person is also of importance.  Most seizures are painless and end spontaneously. Seizures are not harmful to others but can lead to complications such as stress on the lungs, brain and the heart. Individuals with prior lung problems may develop labored breathing and respiratory distress.     Orders Placed This Encounter  Procedures   MR BRAIN W WO CONTRAST   Levetiracetam level   EEG adult    Meds ordered this encounter  Medications   levETIRAcetam (KEPPRA) 500 MG tablet    Sig: Take 1 tablet (500 mg total) by mouth 2 (two) times daily.    Dispense:  180 tablet    Refill:  3    Return in about 6 months (around 07/01/2023).    Alric Ran, MD 12/30/2022, 2:45 PM  [facility] [address] [place], [place] [address] [phone]

## 2023-01-01 LAB — LEVETIRACETAM LEVEL: Levetiracetam Lvl: 12 ug/mL (ref 10.0–40.0)

## 2023-01-12 ENCOUNTER — Ambulatory Visit (INDEPENDENT_AMBULATORY_CARE_PROVIDER_SITE_OTHER): Payer: Federal, State, Local not specified - PPO | Admitting: Obstetrics and Gynecology

## 2023-01-12 ENCOUNTER — Encounter: Payer: Self-pay | Admitting: Obstetrics and Gynecology

## 2023-01-12 ENCOUNTER — Other Ambulatory Visit: Payer: Self-pay

## 2023-01-12 VITALS — BP 112/77 | HR 70 | Wt 204.7 lb

## 2023-01-12 DIAGNOSIS — N939 Abnormal uterine and vaginal bleeding, unspecified: Secondary | ICD-10-CM | POA: Diagnosis not present

## 2023-01-12 DIAGNOSIS — R87619 Unspecified abnormal cytological findings in specimens from cervix uteri: Secondary | ICD-10-CM | POA: Diagnosis not present

## 2023-01-12 NOTE — Progress Notes (Signed)
NEW GYNECOLOGY PATIENT Patient name: Julie Love MRN [id number]  Date of birth: 1975/12/13 Chief Complaint:   Gynecologic Exam     History:  Julie Love is a 47 y.o. G6P0 being seen today for irregular periods and abnormal pap smear.  #irregular menses: states it has been regular since seeing her PCP. Having regular menses for the last 3 months. Had been irregular from October - December. No changes during that time. Weight has been up and down - within baseline. Having hot flashes since 47 years old. No vaginal dryness. Having irritation on the inside, like it's swollen, started Saturday.  No longer gets cramps but will get HA with the first day of her cycle.   #abnormal pap smear:  In December felt hot and overwhelmed and then remembers waking up. Then had another 'hot flash' in May and then wen tto the hospital and was told may have had a seizure.  Not quite 'hot flash' per history as this has happened sporadically and      Gynecologic History No LMP recorded. (Menstrual status: Irregular Periods). Contraception: none Last Pap:     Component Value Date/Time   DIAGPAP - Atypical glandular cells, favor neoplastic (A) 12/17/2021 0919   DIAGPAP  04/27/2020 1243    - Negative for intraepithelial lesion or malignancy (NILM)   HPVHIGH Negative 12/17/2021 0919   HPVHIGH Negative 04/27/2020 1243   ADEQPAP  12/17/2021 0919    Satisfactory for evaluation; transformation zone component PRESENT.   ADEQPAP  04/27/2020 1243    Satisfactory for evaluation; transformation zone component PRESENT.   Last Mammogram: n/a Last Colonoscopy: n/a  Obstetric History OB History  Gravida Para Term Preterm AB Living  6         3  SAB IAB Ectopic Multiple Live Births               # Outcome Date GA Lbr Len/2nd Weight Sex Delivery Anes PTL Lv  6 Gravida           5 Gravida           4 Gravida           3 Gravida           2 Gravida           1 [place]             Past Medical  History:  Diagnosis Date   GERD (gastroesophageal reflux disease)     Past Surgical History:  Procedure Laterality Date   CESAREAN SECTION     FOOT SURGERY Right    TUBAL LIGATION      Current Outpatient Medications on File Prior to Visit  Medication Sig Dispense Refill   diclofenac sodium (VOLTAREN) 1 % GEL Apply 2 g topically 4 (four) times daily. 100 g 0   Ferrous Sulfate (IRON) 325 (65 Fe) MG TABS Take 1 tablet (325 mg total) by mouth daily. Take by mouth. 30 tablet 2   glucosamine-chondroitin 500-400 MG tablet Take 2 tablets by mouth daily.     levETIRAcetam (KEPPRA) 500 MG tablet Take 1 tablet (500 mg total) by mouth 2 (two) times daily. 180 tablet 3   Multiple Vitamins-Minerals (ZINC PO) Take by mouth.     VITAMIN A PO Take by mouth.     VITAMIN D PO Take by mouth.     VITAMIN E PO Take by mouth.     No current facility-administered medications on file prior to  visit.    No Known Allergies  Social History:  reports that she has never smoked. She has never used smokeless tobacco. She reports that she does not drink alcohol and does not use drugs.  Family History  Problem Relation Age of Onset   Hypertension Mother    Cancer Neg Hx    Heart disease Neg Hx    Diabetes Neg Hx     The following portions of the patient's history were reviewed and updated as appropriate: allergies, current medications, past family history, past medical history, past social history, past surgical history and problem list.  Review of Systems Pertinent items noted in HPI and remainder of comprehensive ROS otherwise negative.  Physical Exam:  BP 112/77   Pulse 70   Wt 204 lb 11.2 oz (92.9 kg)   BMI 32.06 kg/m  Physical Exam Vitals and nursing note reviewed.  Constitutional:      Appearance: Normal appearance.  Cardiovascular:     Rate and Rhythm: Normal rate.  Pulmonary:     Effort: Pulmonary effort is normal.     Breath sounds: Normal breath sounds.  Neurological:     General:  No focal deficit present.     Mental Status: She is alert and oriented to person, place, and time.  Psychiatric:        Mood and Affect: Mood normal.        Behavior: Behavior normal.        Thought Content: Thought content normal.        Judgment: Judgment normal.        Assessment and Plan:  1. Abnormal uterine bleeding (AUB) Patient has abnormal uterine bleeding . Willl order abnormal uterine bleeding evaluation labs and pelvic ultrasound to evaluate for any structural gynecologic abnormalities.  Will contact patient with these results and plans for further evaluation/management.  - Follicle stimulating hormone - Hemoglobin A1c - US PELVIC COMPLETE WITH TRANSVAGINAL; Future - TSH Rfx on Abnormal to Free T4  2. Abnormal cervical Papanicolaou smear, unspecified abnormal pap finding Due for colposcopy based abnormal pap with ASCCP recommendation for EMB as well  Routine preventative health maintenance measures emphasized. Please refer to After Visit Summary for other counseling recommendations.   Follow-up: Return for GYN Follow Up.      Lorriane Shire, MD Obstetrician & Gynecologist, Faculty Practice [facility] for [employer], [facility]

## 2023-01-12 NOTE — Patient Instructions (Signed)
You are going to come back for a colposcopy and endometrial biopsy

## 2023-01-13 LAB — HEMOGLOBIN A1C
Est. average glucose Bld gHb Est-mCnc: 103 mg/dL
Hgb A1c MFr Bld: 5.2 % (ref 4.8–5.6)

## 2023-01-13 LAB — FOLLICLE STIMULATING HORMONE: FSH: 12 m[IU]/mL

## 2023-01-13 LAB — TSH RFX ON ABNORMAL TO FREE T4: TSH: 2.08 u[IU]/mL (ref 0.450–4.500)

## 2023-01-15 ENCOUNTER — Encounter: Payer: Self-pay | Admitting: *Deleted

## 2023-01-15 ENCOUNTER — Telehealth: Payer: Self-pay | Admitting: Neurology

## 2023-01-15 ENCOUNTER — Other Ambulatory Visit: Payer: Federal, State, Local not specified - PPO | Admitting: *Deleted

## 2023-01-15 NOTE — Telephone Encounter (Signed)
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 When told of the no show fee he cussed and dropped the "F" bomb several times telling me that [facility] was off their chain. He would not pay the fee.

## 2023-01-19 ENCOUNTER — Ambulatory Visit (HOSPITAL_BASED_OUTPATIENT_CLINIC_OR_DEPARTMENT_OTHER): Payer: Federal, State, Local not specified - PPO

## 2023-01-19 ENCOUNTER — Ambulatory Visit (HOSPITAL_BASED_OUTPATIENT_CLINIC_OR_DEPARTMENT_OTHER)
Admission: RE | Admit: 2023-01-19 | Discharge: 2023-01-19 | Disposition: A | Discharge status: Home / Self Care | Payer: Federal, State, Local not specified - PPO | Source: Ambulatory Visit | Attending: Obstetrics and Gynecology | Admitting: Obstetrics and Gynecology

## 2023-01-19 DIAGNOSIS — N939 Abnormal uterine and vaginal bleeding, unspecified: Secondary | ICD-10-CM | POA: Diagnosis not present

## 2023-01-19 DIAGNOSIS — D251 Intramural leiomyoma of uterus: Secondary | ICD-10-CM | POA: Diagnosis not present

## 2023-01-26 ENCOUNTER — Ambulatory Visit
Admission: RE | Admit: 2023-01-26 | Discharge: 2023-01-26 | Disposition: A | Discharge status: Home / Self Care | Payer: Federal, State, Local not specified - PPO | Source: Ambulatory Visit | Attending: Neurology | Admitting: Neurology

## 2023-01-26 DIAGNOSIS — G40909 Epilepsy, unspecified, not intractable, without status epilepticus: Secondary | ICD-10-CM | POA: Diagnosis not present

## 2023-01-26 DIAGNOSIS — G40009 Localization-related (focal) (partial) idiopathic epilepsy and epileptic syndromes with seizures of localized onset, not intractable, without status epilepticus: Secondary | ICD-10-CM

## 2023-01-26 MED ORDER — GADOPICLENOL 0.5 MMOL/ML IV SOLN
9.0000 mL | Freq: Once | INTRAVENOUS | Status: AC | PRN
  Administered 2023-01-26: 9 mL via INTRAVENOUS

## 2023-02-17 ENCOUNTER — Other Ambulatory Visit (HOSPITAL_COMMUNITY)
Admission: RE | Admit: 2023-02-17 | Discharge: 2023-02-17 | Disposition: A | Discharge status: Home / Self Care | Payer: Federal, State, Local not specified - PPO | Source: Ambulatory Visit | Attending: Obstetrics and Gynecology | Admitting: Obstetrics and Gynecology

## 2023-02-17 ENCOUNTER — Encounter: Payer: Self-pay | Admitting: Obstetrics and Gynecology

## 2023-02-17 ENCOUNTER — Other Ambulatory Visit: Payer: Self-pay

## 2023-02-17 ENCOUNTER — Ambulatory Visit (INDEPENDENT_AMBULATORY_CARE_PROVIDER_SITE_OTHER): Payer: Federal, State, Local not specified - PPO | Admitting: Obstetrics and Gynecology

## 2023-02-17 VITALS — BP 119/83 | HR 68 | Wt 205.1 lb

## 2023-02-17 DIAGNOSIS — N939 Abnormal uterine and vaginal bleeding, unspecified: Secondary | ICD-10-CM | POA: Diagnosis not present

## 2023-02-17 DIAGNOSIS — N858 Other specified noninflammatory disorders of uterus: Secondary | ICD-10-CM | POA: Diagnosis not present

## 2023-02-17 DIAGNOSIS — N72 Inflammatory disease of cervix uteri: Secondary | ICD-10-CM | POA: Diagnosis not present

## 2023-02-17 DIAGNOSIS — R87619 Unspecified abnormal cytological findings in specimens from cervix uteri: Secondary | ICD-10-CM | POA: Diagnosis not present

## 2023-02-17 NOTE — Progress Notes (Signed)
    GYNECOLOGY OFFICE COLPOSCOPY PROCEDURE NOTE  47 y.o. G6P0 here for colposcopy for  atypical glandular cells, favor neoplastic  pap smear on 11/2021. Discussed role for HPV in cervical dysplasia, need for surveillance.  Patient gave informed written consent, time out was performed.  Placed in lithotomy position. Cervix viewed with speculum and colposcope after application of acetic acid.   Colposcopy adequate? Yes  no visible lesions, no mosaicism, no punctation, and no abnormal vasculature; random biposy obtained.  ECC specimen obtained. All specimens were labeled and sent to pathology.   Endometrial Biopsy Procedure  Patient identified, informed consent performed,  indication reviewed, consent signed.  Reviewed risk of perforation, pain, bleeding, insufficient sample, etc were reviewd. Time out was performed.  Urine pregnancy test negative.  Speculum placed in the vagina.  Cervix visualized.  Cleaned with Betadine x 2.   Paracervical block was not administered.  Endometrial pipelle was used to draw up 1cc of 1% lidocaine, introduced into the cervical os and instilled into the endometrial cavity.  The pipelle was passed twice without difficulty and sample obtained. Tenaculum was removed, good hemostasis noted.  Patient tolerated procedure well.  Chaperone was present during entire procedure.  Patient was given post procedure instructions.  Will follow up pathology and manage accordingly; patient will be contacted with results and recommendations.  Routine preventative health maintenance measures emphasized.  Lorriane Shire, MD, FACOG Minimally Invasive Gynecologic Surgery  Obstetrics and Gynecology, [facility] for [facility], [facility] 02/17/2023

## 2023-02-23 LAB — SURGICAL PATHOLOGY

## 2023-03-04 NOTE — Telephone Encounter (Signed)
I called [employer] federal to see if I could get a retro auth for her MRI on 4/29 and she said that they would not do a retro auth for the MRI. I told her that we (GI and I) were under the impression that [employer] federal plans do not required prior authorization and she said that is true for the standard and basic plans, but this patient has a "blue focus" plan which does require authorization. I have made myself a note for the future and let GI know that we need to check what specific plans the patients have. Call reference # [id number]. 03/04/23

## 2023-07-09 ENCOUNTER — Encounter: Payer: Self-pay | Admitting: Neurology

## 2023-07-09 ENCOUNTER — Ambulatory Visit (INDEPENDENT_AMBULATORY_CARE_PROVIDER_SITE_OTHER): Payer: Federal, State, Local not specified - PPO | Admitting: Neurology

## 2023-07-09 VITALS — BP 130/72 | Ht 67.0 in | Wt 214.0 lb

## 2023-07-09 DIAGNOSIS — G40009 Localization-related (focal) (partial) idiopathic epilepsy and epileptic syndromes with seizures of localized onset, not intractable, without status epilepticus: Secondary | ICD-10-CM

## 2023-07-09 MED ORDER — LEVETIRACETAM 500 MG PO TABS: 500.0000 mg | ORAL_TABLET | Freq: Two times a day (BID) | ORAL | 3 refills | Status: DC

## 2023-07-09 NOTE — Progress Notes (Signed)
[facility]  PATIENT: Julie Love DOB: [birth year]  REQUESTING CLINICIAN: [place], Betty G, MD HISTORY FROM: Patient  REASON FOR VISIT: Seizure, here to establish care    HISTORICAL  CHIEF COMPLAINT:  Chief Complaint  Patient presents with   Follow-up    Rm 13, sz f/u, no missed meds, no seizures, denies SI/HI   INTERVAL HISTORY 07/09/2023:  Patient presents today for follow-up, she is alone. Last visit was in April, at that time we checked a Keppra level which was normal at 12, continue her on Keppra 500 mg twice daily.  MRI brain was completed, no acute abnormality.  She is still pending the EEG.  She denies any seizure or seizure activity.  She is compliant with the Keppra denies any side effect from the medication.  No other complaints, no other concerns.   HISTORY OF PRESENT ILLNESS:  This is a 47 year old woman with no reported past medical history who is presenting to establish care for her seizure.  Patient reports that her first event happened on December 24.  She was at work, felt funny then went to sit down and remembers waking up on the floor.  She was told by coworker that she was drooling at the mouth.  She did not go to the hospital.  She did okay until March 20 when she had another event at work.  Again she was at work, felt funny went to sit down and fell to the floor.  She was told by coworkers that she was foaming at the mouth.  EMS was called as patient was taken to the ED.  In the ED she had another event witnessed by her daughter and ED staff.  It started with patient having behavioral arrest during conversation with daughter, head turning to the left, eyes open, tonic posturing and foaming at the mouth.  Patient was given Ativan in the hospital and started on Keppra 500 mg twice daily.  Since then she has been compliant with the Keppra 500 mg twice daily, denies any side effect from the medication and so far has not had any additional events.   She reports that her son had seizures during the first 5 years of his life but has outgrew the seizure, other than that no other family members with seizures and no seizure or other seizure risk factors identified.  Handedness: Continue with Keppra 500 mg  Onset: September 21 2022  Seizure Type: Behavioral arrest, head turn to the left, foaming at the mouth and stiffness   Current frequency: Last event was March 20  Any injuries from seizures: Denies   Seizure risk factors: Son with seizure in the first 5 years  Previous ASMs: None   Currenty ASMs: Levetiracetam   ASMs side effects: Levetiracetam 500 mg twice daily   Brain Images: Normal head CT   Previous EEGs: Not previously done    OTHER MEDICAL CONDITIONS: Denies  REVIEW OF SYSTEMS: Full 14 system review of systems performed and negative with exception of: As noted in HPI  ALLERGIES: No Known Allergies  HOME MEDICATIONS: Outpatient Medications Prior to Visit  Medication Sig Dispense Refill   diclofenac sodium (VOLTAREN) 1 % GEL Apply 2 g topically 4 (four) times daily. 100 g 0   Ferrous Sulfate (IRON) 325 (65 Fe) MG TABS Take 1 tablet (325 mg total) by mouth daily. Take by mouth. 30 tablet 2   glucosamine-chondroitin 500-400 MG tablet Take 2 tablets by mouth daily.     Multiple  Vitamins-Minerals (ZINC PO) Take by mouth.     VITAMIN A PO Take by mouth.     VITAMIN D PO Take by mouth.     VITAMIN E PO Take by mouth.     levETIRAcetam (KEPPRA) 500 MG tablet Take 1 tablet (500 mg total) by mouth 2 (two) times daily. 180 tablet 3   No facility-administered medications prior to visit.    PAST MEDICAL HISTORY: Past Medical History:  Diagnosis Date   GERD (gastroesophageal reflux disease)     PAST SURGICAL HISTORY: Past Surgical History:  Procedure Laterality Date   CESAREAN SECTION     FOOT SURGERY Right    TUBAL LIGATION      FAMILY HISTORY: Family History  Problem Relation Age of Onset   Hypertension  Mother    Cancer Neg Hx    Heart disease Neg Hx    Diabetes Neg Hx     SOCIAL HISTORY: Social History   Socioeconomic History   Marital status: Married    Spouse name: Not on file   Number of children: Not on file   Years of education: Not on file   Highest education level: Not on file  Occupational History   Not on file  Tobacco Use   Smoking status: Never   Smokeless tobacco: Never  Substance and Sexual Activity   Alcohol use: No    Alcohol/week: 0.0 standard drinks of alcohol   Drug use: No   Sexual activity: Yes  Other Topics Concern   Not on file  Social History Narrative   Right handed   Caffeine- occasionally   Social Determinants of Health   Financial Resource Strain: Not on file  Food Insecurity: Not on file  Transportation Needs: Not on file  Physical Activity: Not on file  Stress: Not on file  Social Connections: Not on file  Intimate Partner Violence: Not on file    PHYSICAL EXAM  GENERAL EXAM/CONSTITUTIONAL: Vitals:  Vitals:   07/09/23 0855  BP: 130/72  Weight: 214 lb (97.1 kg)  Height: 5\' 7"  (1.702 m)   Body mass index is 33.52 kg/m. Wt Readings from Last 3 Encounters:  07/09/23 214 lb (97.1 kg)  02/17/23 205 lb 1.6 oz (93 kg)  01/12/23 204 lb 11.2 oz (92.9 kg)   Patient is in no distress; well developed, nourished and groomed; neck is supple  MUSCULOSKELETAL: Gait, strength, tone, movements noted in Neurologic exam below  NEUROLOGIC: MENTAL STATUS:      No data to display         awake, alert, oriented to person, place and time recent and remote memory intact normal attention and concentration language fluent, comprehension intact, naming intact fund of knowledge appropriate  CRANIAL NERVE:  2nd, 3rd, 4th, 6th - Visual fields full to confrontation, extraocular muscles intact, no nystagmus 5th - facial sensation symmetric 7th - facial strength symmetric 8th - hearing intact 9th - palate elevates symmetrically, uvula  midline 11th - shoulder shrug symmetric 12th - tongue protrusion midline  MOTOR:  normal bulk and tone, full strength in the BUE, BLE  SENSORY:  normal and symmetric to light touch  COORDINATION:  finger-nose-finger, fine finger movements normal  GAIT/STATION:  normal   DIAGNOSTIC DATA (LABS, IMAGING, TESTING) - I reviewed patient records, labs, notes, testing and imaging myself where available.  Lab Results  Component Value Date   WBC 2.7 (L) 12/17/2022   HGB 11.2 (L) 12/17/2022   HCT 34.3 (L) 12/17/2022   MCV 91.5 12/17/2022  PLT 148 (L) 12/17/2022      Component Value Date/Time   NA 133 (L) 12/17/2022 0210   K 3.7 12/17/2022 0210   CL 104 12/17/2022 0210   CO2 23 12/17/2022 0210   GLUCOSE 87 12/17/2022 0210   BUN 9 12/17/2022 0210   CREATININE 0.76 12/17/2022 0210   CREATININE 0.80 04/27/2020 1301   CALCIUM 8.7 (L) 12/17/2022 0210   PROT 8.1 12/17/2022 0250   ALBUMIN 3.2 (L) 12/17/2022 0250   AST 29 12/17/2022 0250   ALT 16 12/17/2022 0250   ALKPHOS 41 12/17/2022 0250   BILITOT 0.4 12/17/2022 0250   GFRNONAA >60 12/17/2022 0210   Lab Results  Component Value Date   CHOL 128 07/24/2021   HDL 40.20 07/24/2021   LDLCALC 79 07/24/2021   TRIG 43.0 07/24/2021   Lab Results  Component Value Date   HGBA1C 5.2 01/12/2023   No results found for: "VITAMINB12" Lab Results  Component Value Date   TSH 2.080 01/12/2023   MRI Brain 01/26/2023 T2/FLAIR hyperintense foci in the right cerebral hemisphere and 1 focus in the right pons.  They do not enhance and did not appear to be acute.  These are nonspecific and most likely reflect minimal chronic microvascular ischemic changes. The structures of the medial temporal lobes have normal signal.  However, there is asymmetry of the temporal horns of the lateral ventricles, larger on the right.  As the adjacent brain appears normal, this is likely an incidental finding. Normal enhancement pattern.  No acute findings.    Head CT 12/17/2022 1. No acute intracranial abnormality.   I personally reviewed brain Images.   ASSESSMENT AND PLAN  47 y.o. year old female  with no reported past medical history who is presenting for follow-up for her epilepsy.  She is doing well on Keppra 500 mg twice daily, no seizure or seizure-like activity.  Plan will be to continue patient on Keppra 500 mg twice daily, and will continue to follow-up yearly  Return sooner if needed.    1. Partial idiopathic epilepsy with seizures of localized onset, not intractable, without status epilepticus [facility])      Patient Instructions  Continue with Keppra 500 mg twice daily Continue your other medications Follow-up in 1 year or sooner if worse     Per [facility] statutes, patients with seizures are not allowed to drive until they have been seizure-free for six months.  Other recommendations include using caution when using heavy equipment or power tools. Avoid working on ladders or at heights. Take showers instead of baths.  Do not swim alone.  Ensure the water temperature is not too high on the home water heater. Do not go swimming alone. Do not lock yourself in a room alone (i.e. bathroom). When caring for infants or small children, sit down when holding, feeding, or changing them to minimize risk of injury to the child in the event you have a seizure. Maintain good sleep hygiene. Avoid alcohol.  Also recommend adequate sleep, hydration, good diet and minimize stress.   During the Seizure  - First, ensure adequate ventilation and place patients on the floor on their left side  Loosen clothing around the neck and ensure the airway is patent. If the patient is clenching the teeth, do not force the mouth open with any object as this can cause severe damage - Remove all items from the surrounding that can be hazardous. The patient may be oblivious to what's happening and may not even  know what he or she is doing. If the patient is  confused and wandering, either gently guide him/her away and block access to outside areas - Reassure the individual and be comforting - Call 911. In most cases, the seizure ends before EMS arrives. However, there are cases when seizures may last over 3 to 5 minutes. Or the individual may have developed breathing difficulties or severe injuries. If a pregnant patient or a person with diabetes develops a seizure, it is prudent to call an ambulance. - Finally, if the patient does not regain full consciousness, then call EMS. Most patients will remain confused for about 45 to 90 minutes after a seizure, so you must use judgment in calling for help. - Avoid restraints but make sure the patient is in a bed with padded side rails - Place the individual in a lateral position with the neck slightly flexed; this will help the saliva drain from the mouth and prevent the tongue from falling backward - Remove all nearby furniture and other hazards from the area - Provide verbal assurance as the individual is regaining consciousness - Provide the patient with privacy if possible - Call for help and start treatment as ordered by the caregiver   After the Seizure (Postictal Stage)  After a seizure, most patients experience confusion, fatigue, muscle pain and/or a headache. Thus, one should permit the individual to sleep. For the next few days, reassurance is essential. Being calm and helping reorient the person is also of importance.  Most seizures are painless and end spontaneously. Seizures are not harmful to others but can lead to complications such as stress on the lungs, brain and the heart. Individuals with prior lung problems may develop labored breathing and respiratory distress.     No orders of the defined types were placed in this encounter.   Meds ordered this encounter  Medications   levETIRAcetam (KEPPRA) 500 MG tablet    Sig: Take 1 tablet (500 mg total) by mouth 2 (two) times daily.     Dispense:  180 tablet    Refill:  3    Return in about 1 year (around 07/08/2024).    Windell Norfolk, MD 07/09/2023, 10:17 AM  [facility] [address], [address] [place], [place] [address] [phone]

## 2023-07-09 NOTE — Patient Instructions (Signed)
Continue with Keppra 500 mg twice daily Continue your other medications Follow-up in 1 year or sooner if worse

## 2023-10-23 NOTE — Progress Notes (Signed)
HPI: [name] is a 48 y.o. female with a PMHx significant for iron deficiency anemia and thoracic spine pain, who is here today for her routine physical.  Last CPE: 07/24/2021  Exercise: Diet:  Sleep:  Alcohol Use:  Smoking: Vision:  Dental:  Immunization History  Administered Date(s) Administered   Influenza,inj,Quad PF,6+ Mos 07/24/2021, 06/18/2022   Influenza-Unspecified 06/29/2018   Tdap 09/12/2014   Health Maintenance  Topic Date Due   Colonoscopy  Never done   INFLUENZA VACCINE  04/30/2023   COVID-19 Vaccine (1 - 2024-25 season) Never done   DTaP/Tdap/Td (2 - Td or Tdap) 09/12/2024   Cervical Cancer Screening (HPV/Pap Cotest)  12/18/2026   Hepatitis C Screening  Completed   HIV Screening  Completed   HPV VACCINES  Aged Out   Chronic medical problems: ***  Concerns today: ***  Review of Systems  Current Outpatient Medications on File Prior to Visit  Medication Sig Dispense Refill   diclofenac sodium (VOLTAREN) 1 % GEL Apply 2 g topically 4 (four) times daily. 100 g 0   Ferrous Sulfate (IRON) 325 (65 Fe) MG TABS Take 1 tablet (325 mg total) by mouth daily. Take by mouth. 30 tablet 2   glucosamine-chondroitin 500-400 MG tablet Take 2 tablets by mouth daily.     levETIRAcetam (KEPPRA) 500 MG tablet Take 1 tablet (500 mg total) by mouth 2 (two) times daily. 180 tablet 3   Multiple Vitamins-Minerals (ZINC PO) Take by mouth.     VITAMIN A PO Take by mouth.     VITAMIN D PO Take by mouth.     VITAMIN E PO Take by mouth.     No current facility-administered medications on file prior to visit.    Past Medical History:  Diagnosis Date   GERD (gastroesophageal reflux disease)     Past Surgical History:  Procedure Laterality Date   CESAREAN SECTION     FOOT SURGERY Right    TUBAL LIGATION      No Known Allergies  Family History  Problem Relation Age of Onset   Hypertension Mother    Cancer Neg Hx    Heart disease Neg Hx    Diabetes Neg Hx      Social History   Socioeconomic History   Marital status: Divorced    Spouse name: Not on file   Number of children: Not on file   Years of education: Not on file   Highest education level: Not on file  Occupational History   Not on file  Tobacco Use   Smoking status: Never   Smokeless tobacco: Never  Substance and Sexual Activity   Alcohol use: No    Alcohol/week: 0.0 standard drinks of alcohol   Drug use: No   Sexual activity: Yes  Other Topics Concern   Not on file  Social History Narrative   Right handed   Caffeine- occasionally   Social Drivers of Health   Financial Resource Strain: Not on file  Food Insecurity: Not on file  Transportation Needs: Not on file  Physical Activity: Not on file  Stress: Not on file  Social Connections: Not on file    There were no vitals filed for this visit. There is no height or weight on file to calculate BMI.  Wt Readings from Last 3 Encounters:  07/09/23 214 lb (97.1 kg)  02/17/23 205 lb 1.6 oz (93 kg)  01/12/23 204 lb 11.2 oz (92.9 kg)    Physical Exam  ASSESSMENT AND  PLAN:  [name] was here today for her annual physical examination.  No orders of the defined types were placed in this encounter.   @ASSESSPLAN @  There are no diagnoses linked to this encounter.  No follow-ups on file.  I, Julie Love, acting as a scribe for Julie Mesa [place], MD., have documented all relevant documentation on the behalf of Julie Chanda [place], MD, as directed by  Julie Spangler [place], MD while in the presence of Julie Manthei [place], MD.   I, Julie Agard [place], MD, have reviewed all documentation for this visit. The documentation on 10/23/23 for the exam, diagnosis, procedures, and orders are all accurate and complete.  Julie Schwinn G. [place], MD  [facility]. [place] office.

## 2023-10-26 ENCOUNTER — Encounter: Payer: Self-pay | Admitting: Family Medicine

## 2023-10-26 ENCOUNTER — Ambulatory Visit (INDEPENDENT_AMBULATORY_CARE_PROVIDER_SITE_OTHER): Payer: BC Managed Care – PPO | Admitting: Family Medicine

## 2023-10-26 VITALS — BP 122/80 | HR 76 | Resp 12 | Ht 67.0 in | Wt 212.2 lb

## 2023-10-26 DIAGNOSIS — Z0000 Encounter for general adult medical examination without abnormal findings: Secondary | ICD-10-CM

## 2023-10-26 DIAGNOSIS — Z1211 Encounter for screening for malignant neoplasm of colon: Secondary | ICD-10-CM

## 2023-10-26 DIAGNOSIS — Z130 Encounter for screening for diseases of the blood and blood-forming organs and certain disorders involving the immune mechanism: Secondary | ICD-10-CM | POA: Diagnosis not present

## 2023-10-26 DIAGNOSIS — G40009 Localization-related (focal) (partial) idiopathic epilepsy and epileptic syndromes with seizures of localized onset, not intractable, without status epilepticus: Secondary | ICD-10-CM

## 2023-10-26 DIAGNOSIS — Z1329 Encounter for screening for other suspected endocrine disorder: Secondary | ICD-10-CM | POA: Diagnosis not present

## 2023-10-26 DIAGNOSIS — Z13228 Encounter for screening for other metabolic disorders: Secondary | ICD-10-CM | POA: Diagnosis not present

## 2023-10-26 DIAGNOSIS — Z13220 Encounter for screening for lipoid disorders: Secondary | ICD-10-CM | POA: Diagnosis not present

## 2023-10-26 DIAGNOSIS — Z23 Encounter for immunization: Secondary | ICD-10-CM

## 2023-10-26 LAB — COMPREHENSIVE METABOLIC PANEL
ALT: 12 U/L (ref 0–35)
AST: 21 U/L (ref 0–37)
Albumin: 3.7 g/dL (ref 3.5–5.2)
Alkaline Phosphatase: 43 U/L (ref 39–117)
BUN: 12 mg/dL (ref 6–23)
CO2: 26 meq/L (ref 19–32)
Calcium: 8.8 mg/dL (ref 8.4–10.5)
Chloride: 104 meq/L (ref 96–112)
Creatinine, Ser: 0.84 mg/dL (ref 0.40–1.20)
GFR: 82.94 mL/min (ref >60.00)
Glucose, Bld: 74 mg/dL (ref 70–99)
Potassium: 3.6 meq/L (ref 3.5–5.1)
Sodium: 135 meq/L (ref 135–145)
Total Bilirubin: 0.3 mg/dL (ref 0.2–1.2)
Total Protein: 7.9 g/dL (ref 6.0–8.3)

## 2023-10-26 LAB — LIPID PANEL
Cholesterol: 139 mg/dL (ref 0–200)
HDL: 39.8 mg/dL (ref >39.00)
LDL Cholesterol: 86 mg/dL (ref 0–99)
NonHDL: 99.65
Total CHOL/HDL Ratio: 4
Triglycerides: 67 mg/dL (ref 0.0–149.0)
VLDL: 13.4 mg/dL (ref 0.0–40.0)

## 2023-10-26 NOTE — Assessment & Plan Note (Signed)
We discussed the importance of regular physical activity and healthy diet for prevention of chronic illness and/or complications. Preventive guidelines reviewed. Vaccination up-to-date. Colon cancer screening overdue, GI referral placed. Continue following with her gynecologist for her female preventive care. Next CPE in a year.

## 2023-10-26 NOTE — Patient Instructions (Addendum)
A few things to remember from today's visit:  Routine general medical examination at a health care facility  Screening for lipoid disorders - Plan: Lipid panel  Screening for endocrine, metabolic and immunity disorder - Plan: Comprehensive metabolic panel  Do not use My Chart to request refills or for acute issues that need immediate attention. If you send a my chart message, it may take a few days to be addressed, specially if I am not in the office.  Please be sure medication list is accurate. If a new problem present, please set up appointment sooner than planned today.  Health Maintenance, Female Adopting a healthy lifestyle and getting preventive care are important in promoting health and wellness. Ask your health care provider about: The right schedule for you to have regular tests and exams. Things you can do on your own to prevent diseases and keep yourself healthy. What should I know about diet, weight, and exercise? Eat a healthy diet  Eat a diet that includes plenty of vegetables, fruits, low-fat dairy products, and lean protein. Do not eat a lot of foods that are high in solid fats, added sugars, or sodium. Maintain a healthy weight Body mass index (BMI) is used to identify weight problems. It estimates body fat based on height and weight. Your health care provider can help determine your BMI and help you achieve or maintain a healthy weight. Get regular exercise Get regular exercise. This is one of the most important things you can do for your health. Most adults should: Exercise for at least 150 minutes each week. The exercise should increase your heart rate and make you sweat (moderate-intensity exercise). Do strengthening exercises at least twice a week. This is in addition to the moderate-intensity exercise. Spend less time sitting. Even light physical activity can be beneficial. Watch cholesterol and blood lipids Have your blood tested for lipids and cholesterol at 48  years of age, then have this test every 5 years. Have your cholesterol levels checked more often if: Your lipid or cholesterol levels are high. You are older than 47 years of age. You are at high risk for heart disease. What should I know about cancer screening? Depending on your health history and family history, you may need to have cancer screening at various ages. This may include screening for: Breast cancer. Cervical cancer. Colorectal cancer. Skin cancer. Lung cancer. What should I know about heart disease, diabetes, and high blood pressure? Blood pressure and heart disease High blood pressure causes heart disease and increases the risk of stroke. This is more likely to develop in people who have high blood pressure readings or are overweight. Have your blood pressure checked: Every 3-5 years if you are 80-89 years of age. Every year if you are 70 years old or older. Diabetes Have regular diabetes screenings. This checks your fasting blood sugar level. Have the screening done: Once every three years after age 35 if you are at a normal weight and have a low risk for diabetes. More often and at a younger age if you are overweight or have a high risk for diabetes. What should I know about preventing infection? Hepatitis B If you have a higher risk for hepatitis B, you should be screened for this virus. Talk with your health care provider to find out if you are at risk for hepatitis B infection. Hepatitis C Testing is recommended for: Everyone born from [birth year] through 1965. Anyone with known risk factors for hepatitis C. Sexually transmitted infections (STIs)  Get screened for STIs, including gonorrhea and chlamydia, if: You are sexually active and are younger than 48 years of age. You are older than 48 years of age and your health care provider tells you that you are at risk for this type of infection. Your sexual activity has changed since you were last screened, and you are at  increased risk for chlamydia or gonorrhea. Ask your health care provider if you are at risk. Ask your health care provider about whether you are at high risk for HIV. Your health care provider may recommend a prescription medicine to help prevent HIV infection. If you choose to take medicine to prevent HIV, you should first get tested for HIV. You should then be tested every 3 months for as long as you are taking the medicine. Pregnancy If you are about to stop having your period (premenopausal) and you may become pregnant, seek counseling before you get pregnant. Take 400 to 800 micrograms (mcg) of folic acid every day if you become pregnant. Ask for birth control (contraception) if you want to prevent pregnancy. Osteoporosis and menopause Osteoporosis is a disease in which the bones lose minerals and strength with aging. This can result in bone fractures. If you are 66 years old or older, or if you are at risk for osteoporosis and fractures, ask your health care provider if you should: Be screened for bone loss. Take a calcium or vitamin D supplement to lower your risk of fractures. Be given hormone replacement therapy (HRT) to treat symptoms of menopause. Follow these instructions at home: Alcohol use Do not drink alcohol if: Your health care provider tells you not to drink. You are pregnant, may be pregnant, or are planning to become pregnant. If you drink alcohol: Limit how much you have to: 0-1 drink a day. Know how much alcohol is in your drink. In the [place] one drink equals one 12 oz bottle of beer (355 mL), one 5 oz glass of wine (148 mL), or one 1 oz glass of hard liquor (44 mL). Lifestyle Do not use any products that contain nicotine or tobacco. These products include cigarettes, chewing tobacco, and vaping devices, such as e-cigarettes. If you need help quitting, ask your health care provider. Do not use street drugs. Do not share needles. Ask your health care provider for help  if you need support or information about quitting drugs. General instructions Schedule regular health, dental, and eye exams. Stay current with your vaccines. Tell your health care provider if: You often feel depressed. You have ever been abused or do not feel safe at home. Summary Adopting a healthy lifestyle and getting preventive care are important in promoting health and wellness. Follow your health care provider's instructions about healthy diet, exercising, and getting tested or screened for diseases. Follow your health care provider's instructions on monitoring your cholesterol and blood pressure. This information is not intended to replace advice given to you by your health care provider. Make sure you discuss any questions you have with your health care provider. Document Revised: 02/04/2021 Document Reviewed: 02/04/2021 Elsevier Patient Education  2024 [employer].

## 2023-10-26 NOTE — Assessment & Plan Note (Signed)
She has not had an episode in at least a year. Currently on Keppra 500 mg twice daily. Following with neurologist.

## 2023-12-01 DIAGNOSIS — Z0289 Encounter for other administrative examinations: Secondary | ICD-10-CM

## 2023-12-14 ENCOUNTER — Ambulatory Visit (INDEPENDENT_AMBULATORY_CARE_PROVIDER_SITE_OTHER): Admitting: Neurology

## 2023-12-14 ENCOUNTER — Encounter: Payer: Self-pay | Admitting: Neurology

## 2023-12-14 DIAGNOSIS — G40009 Localization-related (focal) (partial) idiopathic epilepsy and epileptic syndromes with seizures of localized onset, not intractable, without status epilepticus: Secondary | ICD-10-CM

## 2023-12-14 NOTE — Procedures (Signed)
    History:  48 year old woman with epilepsy   EEG classification: Awake and drowsy  Duration: 26 minutes   Technical aspects: This EEG study was done with scalp electrodes positioned according to the 10-20 International system of electrode placement. Electrical activity was reviewed with band pass filter of 1-70Hz , sensitivity of 7 uV/mm, display speed of 17mm/sec with a 60Hz  notched filter applied as appropriate. EEG data were recorded continuously and digitally stored.   Description of the recording: The background rhythms of this recording consists of a fairly well modulated medium amplitude alpha rhythm of 11 Hz that is reactive to eye opening and closure. Present in the anterior head region is a 15-20 Hz beta activity. Photic stimulation was performed, did not show any abnormalities. Hyperventilation was also performed, did not show any abnormalities. No abnormal epileptiform discharges seen during this recording. There was intermittent left frontotemporal focal slowing. There were no electrographic seizure identified.   Abnormality: Intermittent left frontotemporal slowing   Impression: This is an abnormal awake EEG due to presence of intermittent left frontotemporal slowing. This is consistent with area of neuronal dysfunction in the left frontotemporal region. No evidence of interictal epileptiform discharges.    Windell Norfolk, MD [facility]

## 2024-02-23 ENCOUNTER — Telehealth: Payer: Self-pay | Admitting: Neurology

## 2024-02-23 NOTE — Telephone Encounter (Signed)
 LVM and sent mychart msg informing pt of need to reschedule 07/14/24 appt - MD out

## 2024-07-14 ENCOUNTER — Ambulatory Visit: Payer: Federal, State, Local not specified - PPO | Admitting: Neurology

## 2024-08-09 NOTE — Progress Notes (Signed)
 This encounter was created in error - please disregard.

## 2024-10-02 ENCOUNTER — Other Ambulatory Visit: Payer: Self-pay | Admitting: Neurology

## 2024-10-31 ENCOUNTER — Encounter: Admitting: Family Medicine

## 2024-11-01 ENCOUNTER — Encounter: Admitting: Family Medicine

## 2024-11-07 ENCOUNTER — Encounter: Admitting: Family Medicine

## 2025-02-08 ENCOUNTER — Ambulatory Visit: Admitting: Neurology
